# Patient Record
Sex: Female | Born: 1950 | Race: White | Hispanic: No | Marital: Married | Smoking: Never smoker
Health system: Southern US, Community
[De-identification: ages and names within clinical notes are randomized; demographics above are authoritative.]

## PROBLEM LIST (undated history)

## (undated) DIAGNOSIS — M8580 Other specified disorders of bone density and structure, unspecified site: Secondary | ICD-10-CM

## (undated) DIAGNOSIS — Z9221 Personal history of antineoplastic chemotherapy: Secondary | ICD-10-CM

## (undated) DIAGNOSIS — T7840XA Allergy, unspecified, initial encounter: Secondary | ICD-10-CM

## (undated) DIAGNOSIS — N189 Chronic kidney disease, unspecified: Secondary | ICD-10-CM

## (undated) DIAGNOSIS — C801 Malignant (primary) neoplasm, unspecified: Secondary | ICD-10-CM

## (undated) DIAGNOSIS — I10 Essential (primary) hypertension: Secondary | ICD-10-CM

## (undated) DIAGNOSIS — Z923 Personal history of irradiation: Secondary | ICD-10-CM

## (undated) DIAGNOSIS — B019 Varicella without complication: Secondary | ICD-10-CM

## (undated) DIAGNOSIS — M1990 Unspecified osteoarthritis, unspecified site: Secondary | ICD-10-CM

## (undated) DIAGNOSIS — F419 Anxiety disorder, unspecified: Secondary | ICD-10-CM

## (undated) DIAGNOSIS — J45909 Unspecified asthma, uncomplicated: Secondary | ICD-10-CM

## (undated) DIAGNOSIS — C50919 Malignant neoplasm of unspecified site of unspecified female breast: Secondary | ICD-10-CM

## (undated) HISTORY — DX: Unspecified osteoarthritis, unspecified site: M19.90

## (undated) HISTORY — DX: Anxiety disorder, unspecified: F41.9

## (undated) HISTORY — DX: Other specified disorders of bone density and structure, unspecified site: M85.80

## (undated) HISTORY — DX: Varicella without complication: B01.9

## (undated) HISTORY — PX: ABDOMINAL HYSTERECTOMY: SHX81

## (undated) HISTORY — PX: APPENDECTOMY: SHX54

## (undated) HISTORY — DX: Unspecified asthma, uncomplicated: J45.909

## (undated) HISTORY — DX: Essential (primary) hypertension: I10

## (undated) HISTORY — DX: Chronic kidney disease, unspecified: N18.9

## (undated) HISTORY — DX: Allergy, unspecified, initial encounter: T78.40XA

---

## 1988-01-06 DIAGNOSIS — C50919 Malignant neoplasm of unspecified site of unspecified female breast: Secondary | ICD-10-CM

## 1988-01-06 DIAGNOSIS — Z9221 Personal history of antineoplastic chemotherapy: Secondary | ICD-10-CM

## 1988-01-06 HISTORY — DX: Malignant neoplasm of unspecified site of unspecified female breast: C50.919

## 1988-01-06 HISTORY — PX: MASTECTOMY: SHX3

## 1988-01-06 HISTORY — PX: BREAST BIOPSY: SHX20

## 1988-01-06 HISTORY — DX: Personal history of antineoplastic chemotherapy: Z92.21

## 2005-11-25 ENCOUNTER — Ambulatory Visit: Payer: Self-pay | Admitting: Family Medicine

## 2006-09-02 ENCOUNTER — Ambulatory Visit: Payer: Self-pay | Admitting: Unknown Physician Specialty

## 2006-12-29 ENCOUNTER — Ambulatory Visit: Payer: Self-pay | Admitting: Family Medicine

## 2008-01-02 ENCOUNTER — Ambulatory Visit: Payer: Self-pay | Admitting: Family Medicine

## 2009-06-28 ENCOUNTER — Ambulatory Visit: Payer: Self-pay | Admitting: Family Medicine

## 2011-05-07 ENCOUNTER — Ambulatory Visit: Payer: Self-pay | Admitting: Family Medicine

## 2011-11-19 ENCOUNTER — Ambulatory Visit: Payer: Self-pay | Admitting: Unknown Physician Specialty

## 2011-11-23 LAB — PATHOLOGY REPORT

## 2012-01-06 DIAGNOSIS — Z923 Personal history of irradiation: Secondary | ICD-10-CM

## 2012-01-06 HISTORY — DX: Personal history of irradiation: Z92.3

## 2012-06-21 ENCOUNTER — Ambulatory Visit: Payer: Self-pay | Admitting: Obstetrics and Gynecology

## 2012-06-21 LAB — URINALYSIS, COMPLETE
Glucose,UR: NEGATIVE mg/dL (ref 0–75)
Ketone: NEGATIVE
Nitrite: NEGATIVE
Ph: 5 (ref 4.5–8.0)
Protein: NEGATIVE
RBC,UR: 6 /HPF (ref 0–5)
Squamous Epithelial: 1
WBC UR: 27 /HPF (ref 0–5)

## 2012-06-27 ENCOUNTER — Ambulatory Visit: Payer: Self-pay | Admitting: Obstetrics and Gynecology

## 2012-11-14 ENCOUNTER — Ambulatory Visit: Payer: Self-pay | Admitting: Family Medicine

## 2012-11-28 ENCOUNTER — Ambulatory Visit: Payer: Self-pay | Admitting: Obstetrics and Gynecology

## 2012-11-28 LAB — URINALYSIS, COMPLETE
Bacteria: NONE SEEN
Ketone: NEGATIVE
Ph: 5 (ref 4.5–8.0)
Protein: NEGATIVE
RBC,UR: 1 /HPF (ref 0–5)
Specific Gravity: 1.011 (ref 1.003–1.030)
WBC UR: 6 /HPF (ref 0–5)

## 2012-11-28 LAB — HEMOGLOBIN: HGB: 13.8 g/dL (ref 12.0–16.0)

## 2012-12-05 ENCOUNTER — Ambulatory Visit: Payer: Self-pay | Admitting: Obstetrics and Gynecology

## 2012-12-12 ENCOUNTER — Ambulatory Visit: Payer: Self-pay | Admitting: Gynecologic Oncology

## 2012-12-27 ENCOUNTER — Ambulatory Visit: Payer: Self-pay | Admitting: Gynecologic Oncology

## 2013-01-05 ENCOUNTER — Ambulatory Visit: Payer: Self-pay | Admitting: Gynecologic Oncology

## 2013-01-05 DIAGNOSIS — C801 Malignant (primary) neoplasm, unspecified: Secondary | ICD-10-CM

## 2013-01-05 HISTORY — DX: Malignant (primary) neoplasm, unspecified: C80.1

## 2013-02-05 ENCOUNTER — Ambulatory Visit: Payer: Self-pay | Admitting: Radiation Oncology

## 2013-03-05 ENCOUNTER — Ambulatory Visit: Payer: Self-pay | Admitting: Radiation Oncology

## 2013-04-05 ENCOUNTER — Ambulatory Visit: Payer: Self-pay | Admitting: Radiation Oncology

## 2013-06-21 ENCOUNTER — Ambulatory Visit: Payer: Self-pay | Admitting: Radiation Oncology

## 2013-07-11 ENCOUNTER — Ambulatory Visit: Payer: Self-pay | Admitting: Gynecologic Oncology

## 2013-08-03 DIAGNOSIS — J4540 Moderate persistent asthma, uncomplicated: Secondary | ICD-10-CM | POA: Insufficient documentation

## 2013-08-03 DIAGNOSIS — I10 Essential (primary) hypertension: Secondary | ICD-10-CM | POA: Insufficient documentation

## 2013-08-03 DIAGNOSIS — Z853 Personal history of malignant neoplasm of breast: Secondary | ICD-10-CM | POA: Insufficient documentation

## 2013-08-05 ENCOUNTER — Ambulatory Visit: Payer: Self-pay | Admitting: Gynecologic Oncology

## 2013-10-25 ENCOUNTER — Ambulatory Visit: Payer: Self-pay

## 2013-11-05 ENCOUNTER — Ambulatory Visit: Payer: Self-pay

## 2013-11-15 ENCOUNTER — Ambulatory Visit: Payer: Self-pay | Admitting: Internal Medicine

## 2013-12-11 DIAGNOSIS — J302 Other seasonal allergic rhinitis: Secondary | ICD-10-CM | POA: Insufficient documentation

## 2014-04-27 NOTE — Discharge Summary (Signed)
Dates of Admission and Diagnosis:  Date of Admission 05-Dec-2012   Date of Discharge 06-Dec-2012   Admitting Diagnosis PMB, polyps   Final Diagnosis same   Discharge Diagnosis 1 s/p LAVH, BSO, Cystotomy with repair    Chief Complaint/History of Present Illness recurrent PMB.  S/P D&C with benign polyps   Hospital Course:  Hospital Course Did well post-op with excellent UOP.  Tolerating diet from day0   Condition on Discharge Good   DISCHARGE INSTRUCTIONS HOME MEDS:  Medication Reconciliation: Patient's Home Medications at Discharge:     Physician's Instructions:  Home Health? No   Treatments Foley to leg bag   Dressing Care remove dressings   Diet Regular   Activity Limitations No exertional activity  No heavy lifting   Return to Work after follow up visit with MD   Time frame for Follow Up Appointment 1-2 weeks   Electronic Signatures: Edison Nasuti (MD)  (Signed 02-Dec-14 07:31)  Authored: ADMISSION DATE AND DIAGNOSIS, CHIEF COMPLAINT/HPI, HOSPITAL COURSE, [place], PATIENT INSTRUCTIONS   Last Updated: 02-Dec-14 07:31 by Edison Nasuti (MD)

## 2014-04-27 NOTE — Op Note (Signed)
PATIENT NAME:  Audrey Higgins, Audrey Higgins MR#:  [id number] DATE OF BIRTH:  12-23-1950  DATE OF PROCEDURE:  12/05/2012  PREOPERATIVE DIAGNOSIS: Recurrent postmenopausal bleeding.   POSTOPERATIVE DIAGNOSIS: Recurrent postmenopausal bleeding.   PROCEDURES:  1.  Laparoscopic assisted vaginal hysterectomy and bilateral salpingo-oophorectomy. 2.  Repair of incidental cystotomy.  3.  Cystoscopy.  SURGEON: Ricky L. Amalia Hailey, MD  ASSISTANT: Laverta Baltimore, MD  ANESTHESIA: General endotracheal.  FINDINGS: Grossly normal uterus. Ovaries and tubes appeared consistent with previous sterilization procedure. Cystoscopy showed  good repair with good spills from bilateral internal ureteral orifices.   ESTIMATED BLOOD LOSS: 100 mL.  COMPLICATIONS: Bladder cystotomy from below which was repaired.  DRAINS: Foley.  PROCEDURE IN DETAIL: The patient was consented, preoperative antibiotics given, taken to the operating room and placed in the supine position where anesthesia was initiated, placed in the dorsal lithotomy position, prepped and draped in the usual sterile fashion. Foley catheter was inserted. Hulka tenaculum was applied to the cervix and a 10 port was placed infraumbilically and pneumoperitoneum was established. The patient was placed in Trendelenburg position. 5 ports were placed in bilateral lower quadrants with findings as noted above. There were some omental adhesions just to the right of the umbilicus which were left in situ. There was some thickening at the bladder reflexion as well.   Kleppingers were then used to cauterize the infundibulopelvics bilaterally which were divided with Harmonic scalpel. The broad ligaments were developed bilaterally using Harmonic scalpel and the uterine arteries were cauterized. At this point, we turned our attention to the vagina.   The cervix was visualized and grasped, Hulka was removed and replaced a single-tooth tenaculum. A weighted speculum was placed. 10 mL of  dilute vasopressin was injected circumferentially and scalpel was used to circumscribe clockwise from 7 o'clock to 5 o'clock. Direct entry was then made posteriorly using Mayo scissors. Posterior peritoneum was tagged and long-weighted was placed. The uterosacrals were clamped bilaterally, divided and suture ligated with 0 Vicryl.  Attemptd to gain entry anteriorly and immediately encountered what was felt to be bladder mucosa which was confirmed from visualization of foley bulb.  This was deflected anteriorly. I went posterior to the cystotomy and was able to get in to the peritoneal cavity and the remainder of the vaginal hysterectomy was carried out with additional clamps, cuts, and ties using 0 Vicryls. Areas were seen to be hemostatic.  Apices of the rent in the posterior bladder were visualized and measured approximately 1.25 cm from left to right. Running interlocking of 4-0 Vicryl was done from right to left. I then instilled 60 mL of methylene blue stained saline through the Foley catheter. There was still some persistent ooze through the suture line on the right which was re-enforced with 2 more sutures of 4-0 Vicryl. An imbricating layer of 3-0 Vicryl was then placed and there was no further spillage seen with 120 mL of methylene blue stained saline. At this point, the Foley catheter was allowed to empty and the vaginal cuff was closed with running interlocking 0 Vicryl in the usual fashion. The uterosacrals were tied together.   Asked for methylene blue to be given IV and we re-established pneumoperitoneum and looked  through the laparoscope. Areas were seen to be hemostatic. The pneumoperitoneum was allowed to resolve, ports were removed, and incisions closed with deep of 0 Vicryl. Steri-Strips and Band-Aids were applied. Ureteral peristalsis was visualized bilaterally, with normal appearing ureteral caliber noted.  Cystoscopy was then carried out and after 20  minutes there was some trace blue  coming through. Please note that during the laparoscopic evaluation, just recently dictated, ureters were seen to be normal caliber and peristalsing well bilaterally.   At this point, the procedure was felt to have achieved maximum efficacy. The area of repair was seen to be hemostatic cystoscopically. Foley catheter was replaced. The patient was returned to the supine position and left to the care of anesthesia. I anticipate a routine postoperative course.    ____________________________ Rockey Situ. Amalia Hailey, MD rle:sb D: 12/05/2012 10:06:49 ET T: 12/05/2012 10:32:01 ET JOB#: [id number]  cc: Ricky L. Amalia Hailey, MD, <Dictator> Selmer Dominion MD ELECTRONICALLY SIGNED 12/08/2012 9:23

## 2014-04-27 NOTE — Op Note (Signed)
PATIENT NAME:  Audrey Higgins, Audrey Higgins MR#:  [id number] DATE OF BIRTH:  [birth year]  DATE OF PROCEDURE:  06/27/2012  SURGEON: Ricky L. Vedika Dumlao.   PREOPERATIVE DIAGNOSIS: Postmenopausal bleeding.   POSTOPERATIVE DIAGNOSIS: Postmenopausal bleeding.   PROCEDURE: Dilation and curettage, hysteroscopy.   SURGEON: Jalyiah Shelley.    ANESTHESIA: General LMA.   FINDINGS: Stenotic cervix. Grossly unremarkable endometrium. Minimal curettings, no mass, no polyp, etc.    EBL: Minimal.   COMPLICATIONS: None.   SPECIMENS: Endometrial curettings.   DRAINS: In-and-out catheter with a red rubber at the end of the case, with 100 mL of clear urine.   PROCEDURE IN DETAIL: After receiving preoperative antibiotics the patient was taken to the operating room and placed in the supine position where anesthesia was initiated. Placed in the dorsal lithotomy position using Allen stirrups, prepped and draped in the usual sterile fashion. The cervix was visualized and grasped with a single-toothed tenaculum, easily dilated to permit the hysteroscope, with findings as noted above. The hysteroscope was removed and alternating polyp and sharp curettage removed. Minimal amount of curettings. Instruments were removed. The cervix was seen to be hemostatic. The bladder was drained. The patient was turned to the supine position and left to the care of anesthesia. I anticipate a routine postoperative course.    ____________________________ Rockey Situ. Amalia Hailey, MD rle:dm D: 06/27/2012 10:43:02 ET T: 06/27/2012 11:11:27 ET JOB#: [id number]  cc: Ricky L. Amalia Hailey, MD, <Dictator> Selmer Dominion MD ELECTRONICALLY SIGNED 06/28/2012 9:16

## 2014-04-28 NOTE — Consult Note (Signed)
Reason for Visit: This 64 year old Female patient presents to the clinic for initial evaluation of  endometrial carcinoma .   Referred by Dr. Sabra Heck.  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 She eventually had a TAH/BSO  with final pathology showing FIGO grade 2 endometrioid adenocarcinoma 43.5 x 2.5 x 2.5 cm lesion with17 at millimeters invasion of a 22 mm thickness of the endometrial wall. close to 80% of tumor involved greater than 50% myometrial invasion. No lymph vascular invasion was noted cervix was not involved. No lymph node dissection was performed. Patient been seen by Dr. Sabra Heck has done well postoperatively. She is seen today for evaluation regarding radiation therapy. She is having no spotting at this time no dominant complaints no urinary symptoms.  Past Hx:    asthma:    endometrial cancer:    breast cancer:    Appendectomy:    total abdominal hysterectomy:    mastectomy right breast:   Past, Family and Social History:  Past Medical History positive   Respiratory asthma   Past Surgical History appendectomy; mastectomy from breast cancerof the right breast status post chemotherapy and is on tamoxifen therapy and presently   Family History positive   Family History Comments family history positive for mother with breast cancer or 22s grandmother and brother with colon cancer   Social History positive   Social History Comments no smoking history  social EtOH his history   Additional Past Medical and Surgical History accompanied by her husband today   Allergies:   No Known Allergies:   Home Meds:  Home Medications: Medication Instructions Status  Advair Diskus 250 mcg-50 mcg inhalation powder 1 puff(s) inhaled once a day (at bedtime) Active  albuterol CFC free 90 mcg/inh inhalation aerosol 2 puff(s) inhaled once a day, As Needed - for Shortness of Breath Active  Claritin 10 mg oral tablet, disintegrating 1 tab(s) orally once a day Active   Review of Systems:  General negative   Performance Status (ECOG) 0   Skin negative   Breast see HPI   Ophthalmologic negative   ENMT negative   Respiratory and Thorax negative   Cardiovascular negative   Gastrointestinal negative   Genitourinary see HPI   Musculoskeletal negative   Neurological negative   Psychiatric negative   Hematology/Lymphatics negative   Endocrine negative   Allergic/Immunologic negative   Review of Systems   review of systems obtained from nurse's notes  Nursing Notes:  Nursing Vital Signs and Chemo Nursing Nursing Notes: *CC Vital Signs Flowsheet:   06-Jan-15 08:09  Temp Temperature 98.2  Pulse Pulse 90  Respirations Respirations 18  SBP SBP 163  DBP DBP 85  Pain Scale (0-10)  0  Current Weight (kg) (kg) 66.5  Height (cm) centimeters 165.5  BSA (m2) 1.7   Physical Exam:  General/Skin/HEENT:  General normal   Skin normal   Eyes normal   ENMT normal   Head and Neck normal   Additional PE well-developed female in NAD lungs are clear to A&P cardiac examination  is essentially unremarkable. She is status post right mastectomy. Pelvic examination was performed a conjunction with Dr. Sabra Heck. There was some suture material present the vaginal apex that was removed no evidence of mass or nodularity the vaginal apex was noted. Bimanual examination was unremarkable.   Breasts/Resp/CV/GI/GU:  Respiratory and Thorax normal    Cardiovascular normal   Gastrointestinal normal   Genitourinary normal   MS/Neuro/Psych/Lymph:  Musculoskeletal normal   Neurological normal   Lymphatics normal   Other Results:  Radiology Results: LabUnknown:    29-Dec-14 15:08, CT Abdomen and Pelvis With Contrast  PACS Image   CT:  CT Abdomen and Pelvis With Contrast   REASON FOR EXAM:    labs 1st  staging endometrial cancer  COMMENTS:       PROCEDURE: KCT - KCT ABDOMEN/PELVIS W  - Jan 02 2013  3:08PM     CLINICAL DATA:  Staging for endometrial adenocarcinoma status post  hysterectomy 4 weeks ago. Bladder injury during hysterectomy.    EXAM:  CT ABDOMEN AND PELVIS WITH CONTRAST    TECHNIQUE:  Multidetector CT imaging of the abdomen and pelvis was performed  using the standard protocol following bolus administration of  intravenous contrast.  CONTRAST:  115 mm of Isovue 370.    COMPARISON:  No priors.    FINDINGS:  Lung Bases: Unremarkable.    Abdomen/Pelvis: The appearance of the liver, gallbladder, pancreas,  spleen, bilateral adrenal glands and bilateral kidneys is  unremarkable. No significant volume of ascites. No pneumoperitoneum.  No pathologic distention of small bowel. No definite lymphadenopathy  identified within the abdomen or pelvis.    Status post hysterectomy. Ovaries are not confidently identified and  may be surgically absent or atrophic. No abnormal soft tissue masses  are identified within the peritoneal cavity in the abdomen or pelvis  at this time. Urinary bladder is normal in appearance.    Musculoskeletal: There are no aggressive appearing lytic or blastic  lesions noted in thevisualized portions of the skeleton.     IMPRESSION:  1. Status post hysterectomy without evidence to suggest metastatic  disease in the abdomen or pelvis in this patient with history of  endometrial adenocarcinoma.  2. Urinary bladder is grossly normal in appearance on today's study.      Electronically  Signed    By: Vinnie Langton M.D.    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 Plan:   based on the size of her tumor and majority of tumor invading greater than 50% of the myometrium would favor vaginal brachytherapy up to 2350 cGy in 5 fractions using high-dose rate remote afterloading. Do not believe that pelvic radiation therapy using external beam has affected overall survival in these patients and will cause additional morbidity. Favor only vaginal brachytherapy in this patient with this intermediate risk factors for recurrence. Risks and benefits of treatment including potential urinary side effects urgency and frequency, diarrhea, fatigue, and fibrosis of the vaginal cuff all were explained in detail to the patient and her husband. Both seem to comprehend my treatment plan well. We'll now allow another couple weeks of healing and have scheduled for her simulation in the middle of next week.  I would like  to take this opportunity to thank you for allowing me to continue to participate in this patient's care.  CC Referral:  cc: Dr. Amalia Hailey, Dr. Candiss Norse   Electronic Signatures: Baruch Gouty, Roda Shutters (MD)  (Signed 06-Jan-15 11:19)  Authored: HPI, Diagnosis, Past Hx, PFSH, Allergies, Home Meds, ROS, Nursing Notes, Physical Exam, Other Results, Relevent Results, Encounter Assessment and Plan, CC Referring Physician   Last Updated: 06-Jan-15 11:19 by Armstead Peaks (MD)

## 2014-12-17 DIAGNOSIS — N183 Chronic kidney disease, stage 3 unspecified: Secondary | ICD-10-CM | POA: Insufficient documentation

## 2015-04-11 IMAGING — MG MM MAMMO SCREENING UNILAT*L*
1 series · 2 of 2 positions shown · non-contrast
Comparison: Prior studies dating back to 11/25/2005

REASON FOR EXAM: HX BRST CA RT MAST
COMMENTS:

PROCEDURE:     MAM - MAM DGTL UNI SCREEN LEFT W/CAD  - November 14, 2012  [DATE]
CLINICAL DATA: Screening.
DIGITAL SCREENING UNILATERAL LEFT MAMMOGRAM

[L CC · left · 2 of 2 slices shown]
[im 1/2]
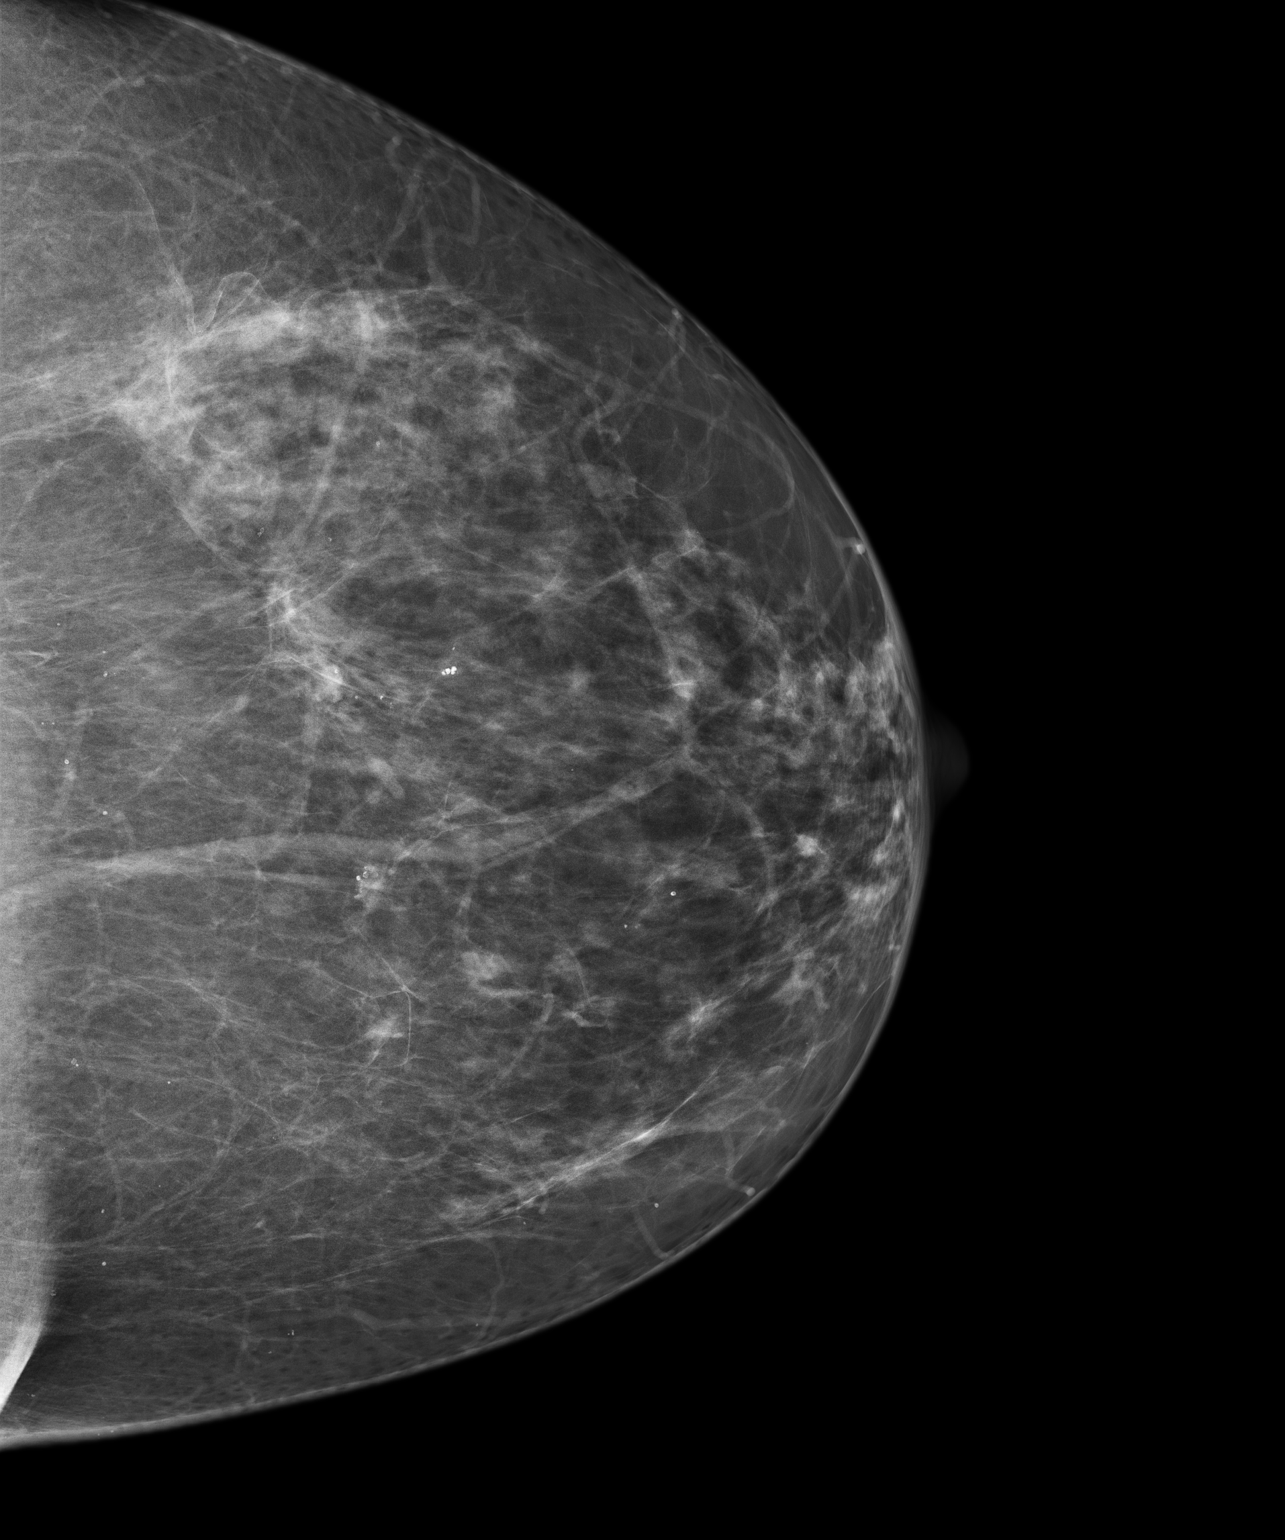
[im 2/2]
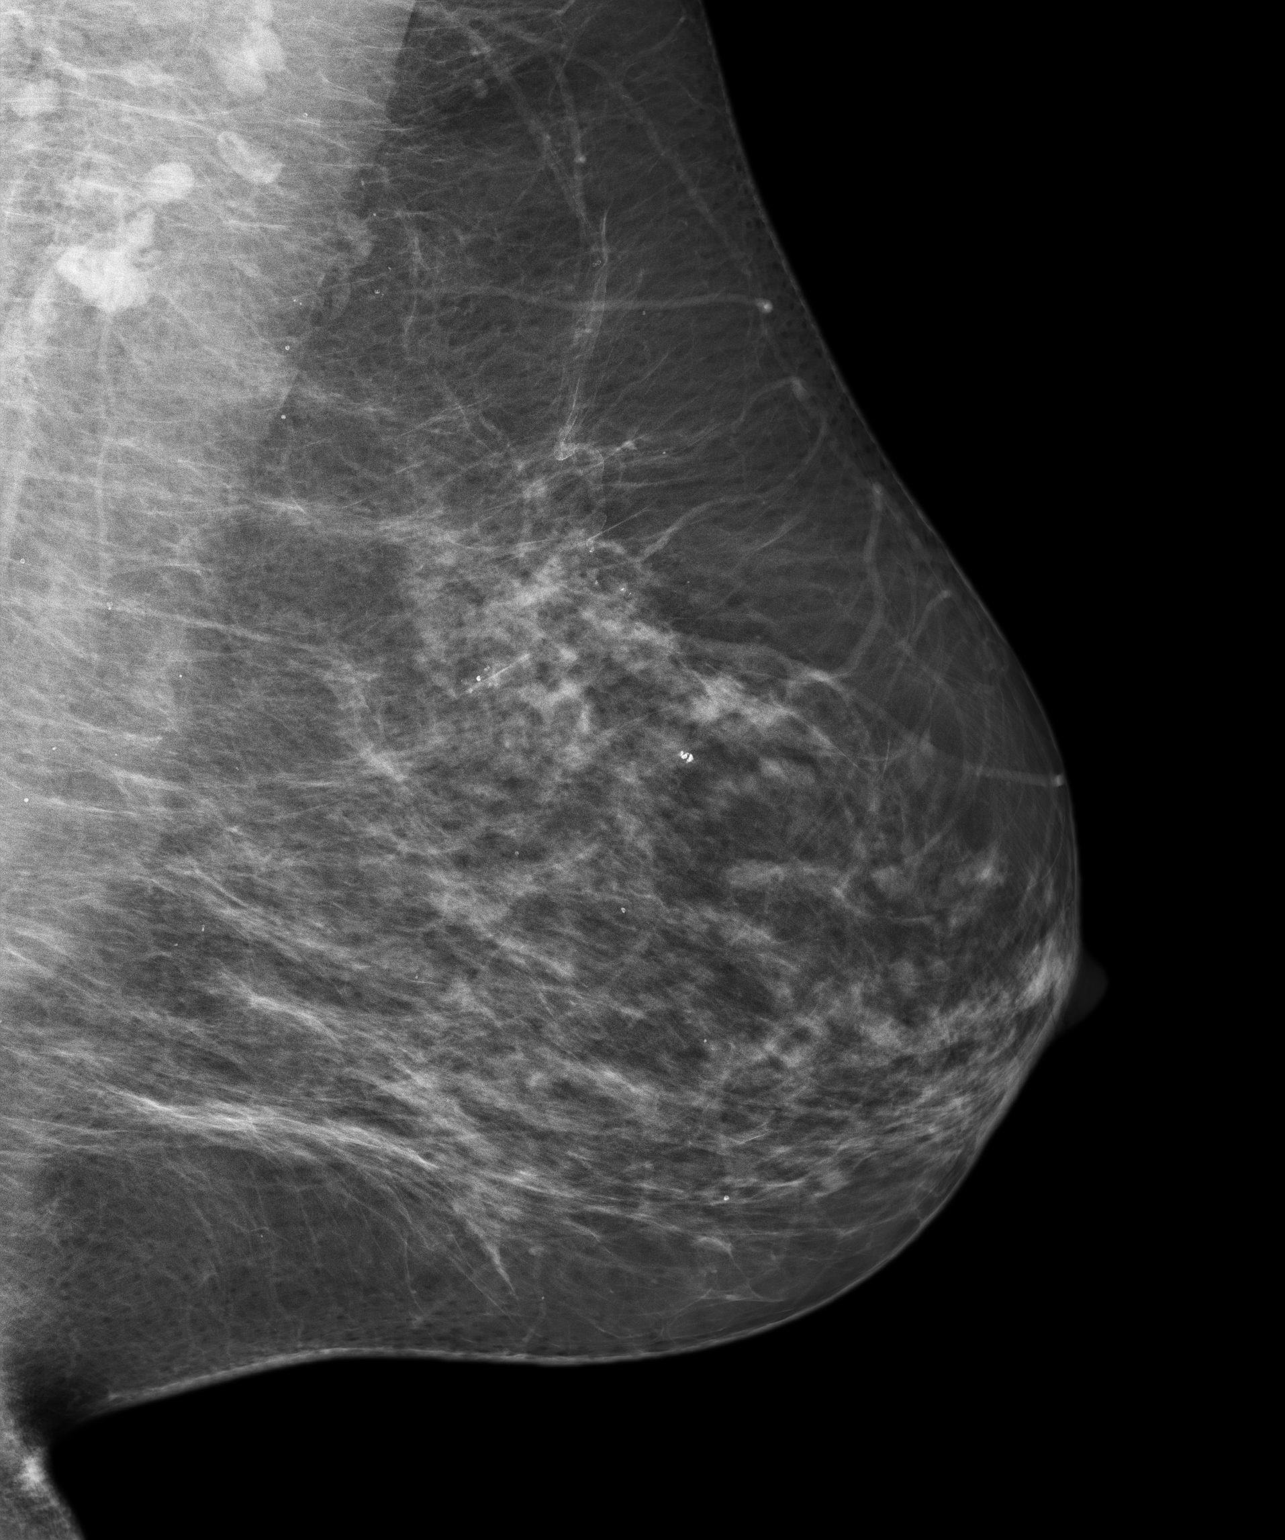

[2 of 2 positions shown; findings below may reference images not displayed]

FINDINGS: ACR Breast Density Category b: There are scattered areas of
fibroglandular density.

There is no suspicious dominant mass, architectural distortion, or
calcification to suggest malignancy.
IMPRESSION: No mammographic evidence of malignancy.

A result letter of this screening mammogram will be mailed directly
to the patient.

RECOMMENDATION:
Screening mammogram in one year. (Code:RB-[id number])

BI-RADS CATEGORY 1:  Negative.

## 2015-06-17 ENCOUNTER — Other Ambulatory Visit: Payer: Self-pay | Admitting: Internal Medicine

## 2015-06-17 DIAGNOSIS — Z1231 Encounter for screening mammogram for malignant neoplasm of breast: Secondary | ICD-10-CM

## 2015-07-04 ENCOUNTER — Ambulatory Visit
Admission: RE | Admit: 2015-07-04 | Discharge: 2015-07-04 | Disposition: A | Discharge status: Home / Self Care | Payer: 59 | Source: Ambulatory Visit | Attending: Internal Medicine | Admitting: Internal Medicine

## 2015-07-04 ENCOUNTER — Other Ambulatory Visit: Payer: Self-pay | Admitting: Internal Medicine

## 2015-07-04 DIAGNOSIS — Z1231 Encounter for screening mammogram for malignant neoplasm of breast: Secondary | ICD-10-CM | POA: Diagnosis not present

## 2015-07-04 HISTORY — DX: Malignant (primary) neoplasm, unspecified: C80.1

## 2015-07-04 HISTORY — DX: Malignant neoplasm of unspecified site of unspecified female breast: C50.919

## 2015-12-17 DIAGNOSIS — Z780 Asymptomatic menopausal state: Secondary | ICD-10-CM | POA: Insufficient documentation

## 2016-06-15 ENCOUNTER — Other Ambulatory Visit: Payer: Self-pay | Admitting: Internal Medicine

## 2016-06-15 DIAGNOSIS — Z1231 Encounter for screening mammogram for malignant neoplasm of breast: Secondary | ICD-10-CM

## 2016-06-17 DIAGNOSIS — R030 Elevated blood-pressure reading, without diagnosis of hypertension: Secondary | ICD-10-CM | POA: Insufficient documentation

## 2016-06-17 DIAGNOSIS — G4709 Other insomnia: Secondary | ICD-10-CM | POA: Insufficient documentation

## 2016-07-06 ENCOUNTER — Ambulatory Visit
Admission: RE | Admit: 2016-07-06 | Discharge: 2016-07-06 | Disposition: A | Discharge status: Home / Self Care | Payer: 59 | Source: Ambulatory Visit | Attending: Internal Medicine | Admitting: Internal Medicine

## 2016-07-06 ENCOUNTER — Other Ambulatory Visit: Payer: Self-pay | Admitting: Internal Medicine

## 2016-07-06 DIAGNOSIS — Z9011 Acquired absence of right breast and nipple: Secondary | ICD-10-CM | POA: Insufficient documentation

## 2016-07-06 DIAGNOSIS — Z1231 Encounter for screening mammogram for malignant neoplasm of breast: Secondary | ICD-10-CM

## 2016-07-06 HISTORY — DX: Personal history of antineoplastic chemotherapy: Z92.21

## 2016-07-06 HISTORY — DX: Personal history of irradiation: Z92.3

## 2017-04-27 DIAGNOSIS — F411 Generalized anxiety disorder: Secondary | ICD-10-CM | POA: Insufficient documentation

## 2019-08-25 ENCOUNTER — Other Ambulatory Visit: Payer: Self-pay

## 2019-08-29 ENCOUNTER — Other Ambulatory Visit: Payer: Self-pay

## 2019-08-29 ENCOUNTER — Encounter: Payer: Self-pay | Admitting: Nurse Practitioner

## 2019-08-29 ENCOUNTER — Ambulatory Visit (INDEPENDENT_AMBULATORY_CARE_PROVIDER_SITE_OTHER): Payer: Medicare HMO | Admitting: Nurse Practitioner

## 2019-08-29 VITALS — BP 170/100 | HR 86 | Temp 98.6°F | Ht 64.0 in | Wt 149.0 lb

## 2019-08-29 DIAGNOSIS — R7303 Prediabetes: Secondary | ICD-10-CM

## 2019-08-29 DIAGNOSIS — Z0000 Encounter for general adult medical examination without abnormal findings: Secondary | ICD-10-CM

## 2019-08-29 DIAGNOSIS — J45909 Unspecified asthma, uncomplicated: Secondary | ICD-10-CM | POA: Diagnosis not present

## 2019-08-29 DIAGNOSIS — J4540 Moderate persistent asthma, uncomplicated: Secondary | ICD-10-CM

## 2019-08-29 DIAGNOSIS — J302 Other seasonal allergic rhinitis: Secondary | ICD-10-CM

## 2019-08-29 DIAGNOSIS — L700 Acne vulgaris: Secondary | ICD-10-CM

## 2019-08-29 DIAGNOSIS — I159 Secondary hypertension, unspecified: Secondary | ICD-10-CM | POA: Insufficient documentation

## 2019-08-29 DIAGNOSIS — Z1231 Encounter for screening mammogram for malignant neoplasm of breast: Secondary | ICD-10-CM

## 2019-08-29 DIAGNOSIS — M810 Age-related osteoporosis without current pathological fracture: Secondary | ICD-10-CM | POA: Insufficient documentation

## 2019-08-29 DIAGNOSIS — I10 Essential (primary) hypertension: Secondary | ICD-10-CM | POA: Diagnosis not present

## 2019-08-29 DIAGNOSIS — Z86010 Personal history of colonic polyps: Secondary | ICD-10-CM

## 2019-08-29 DIAGNOSIS — Z8542 Personal history of malignant neoplasm of other parts of uterus: Secondary | ICD-10-CM

## 2019-08-29 DIAGNOSIS — M8580 Other specified disorders of bone density and structure, unspecified site: Secondary | ICD-10-CM | POA: Insufficient documentation

## 2019-08-29 HISTORY — DX: Personal history of malignant neoplasm of other parts of uterus: Z85.42

## 2019-08-29 MED ORDER — AMLODIPINE BESYLATE 5 MG PO TABS: 2.5000 mg | ORAL_TABLET | Freq: Every day | ORAL | 1 refills | Status: DC

## 2019-08-29 MED ORDER — LORATADINE 10 MG PO TABS: 10.0000 mg | ORAL_TABLET | Freq: Every day | ORAL | 0 refills | Status: AC

## 2019-08-29 MED ORDER — FLUTICASONE PROPIONATE HFA 110 MCG/ACT IN AERO
2.0000 | INHALATION_SPRAY | Freq: Every day | RESPIRATORY_TRACT | 3 refills | Status: DC
Start: 2019-08-29 — End: 2021-05-20

## 2019-08-29 MED ORDER — ALBUTEROL SULFATE HFA 108 (90 BASE) MCG/ACT IN AERS
INHALATION_SPRAY | RESPIRATORY_TRACT | 3 refills | Status: DC
Start: 2019-08-29 — End: 2021-05-20

## 2019-08-29 NOTE — Patient Instructions (Addendum)
Please set up appt for fasting labs this week if possible  BP 160/99 by machine and 170/100 by me: Begin very low dose amlodipine 2.5 mg one time daily. Check BP daily for 3 days and call in readings. See me in 2 weeks for visit.      I have ordered mammo and bone density.  Please call and schedule your 3D mammogram and bone density as discussed.   [place]  [address] [place], [place] [phone]  Referral to GI for colonoscopy you were due in 2016 - per record review.   Referral to [facility] for blackhead on back   Please f/up in 3 mos    Preventive Care 65 Years and Older, Female Preventive care refers to lifestyle choices and visits with your health care provider that can promote health and wellness. This includes:  A yearly physical exam. This is also called an annual well check.  Regular dental and eye exams.  Immunizations.  Screening for certain conditions.  Healthy lifestyle choices, such as diet and exercise. What can I expect for my preventive care visit? Physical exam Your health care provider will check:  Height and weight. These may be used to calculate body mass index (BMI), which is a measurement that tells if you are at a healthy weight.  Heart rate and blood pressure.  Your skin for abnormal spots. Counseling Your health care provider may ask you questions about:  Alcohol, tobacco, and drug use.  Emotional well-being.  Home and relationship well-being.  Sexual activity.  Eating habits.  History of falls.  Memory and ability to understand (cognition).  Work and work Statistician.  Pregnancy and menstrual history. What immunizations do I need?  Influenza (flu) vaccine  This is recommended every year. Tetanus, diphtheria, and pertussis (Tdap) vaccine  You may need a Td booster every 10 years. Varicella (chickenpox) vaccine  You may need this vaccine if you have not already been vaccinated. Zoster  (shingles) vaccine  You may need this after age 81. Pneumococcal conjugate (PCV13) vaccine  One dose is recommended after age 90. Pneumococcal polysaccharide (PPSV23) vaccine  One dose is recommended after age 49. Measles, mumps, and rubella (MMR) vaccine  You may need at least one dose of MMR if you were born in 1957 or later. You may also need a second dose. Meningococcal conjugate (MenACWY) vaccine  You may need this if you have certain conditions. Hepatitis A vaccine  You may need this if you have certain conditions or if you travel or work in places where you may be exposed to hepatitis A. Hepatitis B vaccine  You may need this if you have certain conditions or if you travel or work in places where you may be exposed to hepatitis B. Haemophilus influenzae type b (Hib) vaccine  You may need this if you have certain conditions. You may receive vaccines as individual doses or as more than one vaccine together in one shot (combination vaccines). Talk with your health care provider about the risks and benefits of combination vaccines. What tests do I need? Blood tests  Lipid and cholesterol levels. These may be checked every 5 years, or more frequently depending on your overall health.  Hepatitis C test.  Hepatitis B test. Screening  Lung cancer screening. You may have this screening every year starting at age 57 if you have a 30-pack-year history of smoking and currently smoke or have quit within the past 15 years.  Colorectal cancer screening. All adults  should have this screening starting at age 27 and continuing until age 41. Your health care provider may recommend screening at age 58 if you are at increased risk. You will have tests every 1-10 years, depending on your results and the type of screening test.  Diabetes screening. This is done by checking your blood sugar (glucose) after you have not eaten for a while (fasting). You may have this done every 1-3  years.  Mammogram. This may be done every 1-2 years. Talk with your health care provider about how often you should have regular mammograms.  BRCA-related cancer screening. This may be done if you have a family history of breast, ovarian, tubal, or peritoneal cancers. Other tests  Sexually transmitted disease (STD) testing.  Bone density scan. This is done to screen for osteoporosis. You may have this done starting at age 22. Follow these instructions at home: Eating and drinking  Eat a diet that includes fresh fruits and vegetables, whole grains, lean protein, and low-fat dairy products. Limit your intake of foods with high amounts of sugar, saturated fats, and salt.  Take vitamin and mineral supplements as recommended by your health care provider.  Do not drink alcohol if your health care provider tells you not to drink.  If you drink alcohol: ? Limit how much you have to 0-1 drink a day. ? Be aware of how much alcohol is in your drink. In the [place] one drink equals one 12 oz bottle of beer (355 mL), one 5 oz glass of wine (148 mL), or one 1 oz glass of hard liquor (44 mL). Lifestyle  Take daily care of your teeth and gums.  Stay active. Exercise for at least 30 minutes on 5 or more days each week.  Do not use any products that contain nicotine or tobacco, such as cigarettes, e-cigarettes, and chewing tobacco. If you need help quitting, ask your health care provider.  If you are sexually active, practice safe sex. Use a condom or other form of protection in order to prevent STIs (sexually transmitted infections).  Talk with your health care provider about taking a low-dose aspirin or statin. What's next?  Go to your health care provider once a year for a well check visit.  Ask your health care provider how often you should have your eyes and teeth checked.  Stay up to date on all vaccines. This information is not intended to replace advice given to you by your health care  provider. Make sure you discuss any questions you have with your health care provider. Document Revised: 12/16/2017 Document Reviewed: 12/16/2017 Elsevier Patient Education  [place].  Hypertension, Adult High blood pressure (hypertension) is when the force of blood pumping through the arteries is too strong. The arteries are the blood vessels that carry blood from the heart throughout the body. Hypertension forces the heart to work harder to pump blood and may cause arteries to become narrow or stiff. Untreated or uncontrolled hypertension can cause a heart attack, heart failure, a stroke, kidney disease, and other problems. A blood pressure reading consists of a higher number over a lower number. Ideally, your blood pressure should be below 120/80. The first ("top") number is called the systolic pressure. It is a measure of the pressure in your arteries as your heart beats. The second ("bottom") number is called the diastolic pressure. It is a measure of the pressure in your arteries as the heart relaxes. What are the causes? The exact cause of this  condition is not known. There are some conditions that result in or are related to high blood pressure. What increases the risk? Some risk factors for high blood pressure are under your control. The following factors may make you more likely to develop this condition:  Smoking.  Having type 2 diabetes mellitus, high cholesterol, or both.  Not getting enough exercise or physical activity.  Being overweight.  Having too much fat, sugar, calories, or salt (sodium) in your diet.  Drinking too much alcohol. Some risk factors for high blood pressure may be difficult or impossible to change. Some of these factors include:  Having chronic kidney disease.  Having a family history of high blood pressure.  Age. Risk increases with age.  Race. You may be at higher risk if you are African American.  Gender. Men are at higher risk than  women before age 27. After age 72, women are at higher risk than men.  Having obstructive sleep apnea.  Stress. What are the signs or symptoms? High blood pressure may not cause symptoms. Very high blood pressure (hypertensive crisis) may cause:  Headache.  Anxiety.  Shortness of breath.  Nosebleed.  Nausea and vomiting.  Vision changes.  Severe chest pain.  Seizures. How is this diagnosed? This condition is diagnosed by measuring your blood pressure while you are seated, with your arm resting on a flat surface, your legs uncrossed, and your feet flat on the floor. The cuff of the blood pressure monitor will be placed directly against the skin of your upper arm at the level of your heart. It should be measured at least twice using the same arm. Certain conditions can cause a difference in blood pressure between your right and left arms. Certain factors can cause blood pressure readings to be lower or higher than normal for a short period of time:  When your blood pressure is higher when you are in a health care provider's office than when you are at home, this is called white coat hypertension. Most people with this condition do not need medicines.  When your blood pressure is higher at home than when you are in a health care provider's office, this is called masked hypertension. Most people with this condition may need medicines to control blood pressure. If you have a high blood pressure reading during one visit or you have normal blood pressure with other risk factors, you may be asked to:  Return on a different day to have your blood pressure checked again.  Monitor your blood pressure at home for 1 week or longer. If you are diagnosed with hypertension, you may have other blood or imaging tests to help your health care provider understand your overall risk for other conditions. How is this treated? This condition is treated by making healthy lifestyle changes, such as eating  healthy foods, exercising more, and reducing your alcohol intake. Your health care provider may prescribe medicine if lifestyle changes are not enough to get your blood pressure under control, and if:  Your systolic blood pressure is above 130.  Your diastolic blood pressure is above 80. Your personal target blood pressure may vary depending on your medical conditions, your age, and other factors. Follow these instructions at home: Eating and drinking   Eat a diet that is high in fiber and potassium, and low in sodium, added sugar, and fat. An example eating plan is called the DASH (Dietary Approaches to Stop Hypertension) diet. To eat this way: ? Eat plenty of fresh  fruits and vegetables. Try to fill one half of your plate at each meal with fruits and vegetables. ? Eat whole grains, such as whole-wheat pasta, brown rice, or whole-grain bread. Fill about one fourth of your plate with whole grains. ? Eat or drink low-fat dairy products, such as skim milk or low-fat yogurt. ? Avoid fatty cuts of meat, processed or cured meats, and poultry with skin. Fill about one fourth of your plate with lean proteins, such as fish, chicken without skin, beans, eggs, or tofu. ? Avoid pre-made and processed foods. These tend to be higher in sodium, added sugar, and fat.  Reduce your daily sodium intake. Most people with hypertension should eat less than 1,500 mg of sodium a day.  Do not drink alcohol if: ? Your health care provider tells you not to drink. ? You are pregnant, may be pregnant, or are planning to become pregnant.  If you drink alcohol: ? Limit how much you use to:  0-1 drink a day for women.  0-2 drinks a day for men. ? Be aware of how much alcohol is in your drink. In the [place] one drink equals one 12 oz bottle of beer (355 mL), one 5 oz glass of wine (148 mL), or one 1 oz glass of hard liquor (44 mL). Lifestyle   Work with your health care provider to maintain a healthy body weight or  to lose weight. Ask what an ideal weight is for you.  Get at least 30 minutes of exercise most days of the week. Activities may include walking, swimming, or biking.  Include exercise to strengthen your muscles (resistance exercise), such as Pilates or lifting weights, as part of your weekly exercise routine. Try to do these types of exercises for 30 minutes at least 3 days a week.  Do not use any products that contain nicotine or tobacco, such as cigarettes, e-cigarettes, and chewing tobacco. If you need help quitting, ask your health care provider.  Monitor your blood pressure at home as told by your health care provider.  Keep all follow-up visits as told by your health care provider. This is important. Medicines  Take over-the-counter and prescription medicines only as told by your health care provider. Follow directions carefully. Blood pressure medicines must be taken as prescribed.  Do not skip doses of blood pressure medicine. Doing this puts you at risk for problems and can make the medicine less effective.  Ask your health care provider about side effects or reactions to medicines that you should watch for. Contact a health care provider if you:  Think you are having a reaction to a medicine you are taking.  Have headaches that keep coming back (recurring).  Feel dizzy.  Have swelling in your ankles.  Have trouble with your vision. Get help right away if you:  Develop a severe headache or confusion.  Have unusual weakness or numbness.  Feel faint.  Have severe pain in your chest or abdomen.  Vomit repeatedly.  Have trouble breathing. Summary  Hypertension is when the force of blood pumping through your arteries is too strong. If this condition is not controlled, it may put you at risk for serious complications.  Your personal target blood pressure may vary depending on your medical conditions, your age, and other factors. For most people, a normal blood  pressure is less than 120/80.  Hypertension is treated with lifestyle changes, medicines, or a combination of both. Lifestyle changes include losing weight, eating a healthy, low-sodium diet,  exercising more, and limiting alcohol. This information is not intended to replace advice given to you by your health care provider. Make sure you discuss any questions you have with your health care provider. Document Revised: 09/01/2017 Document Reviewed: 09/01/2017 Elsevier Patient Education  2020 [employer].

## 2019-08-29 NOTE — Progress Notes (Signed)
New Patient Office Visit  Subjective:  Patient ID: Audrey Higgins, female    DOB: 01-Apr-1950  Age: 69 y.o. MRN: [id number]  CC:  Chief Complaint  Patient presents with  . New Patient (Initial Visit)    establish care    HPI Audrey Higgins is a 69 yo with HTN, CKD, hx of breast ca, hx of endometrial ca,  asthma, allergic rhinitis, GAD, osteoporosis, who presents to establish care with a primary care provider and ger her medications refilled. She has no complaints today.   Asthma Mod controlled : Her asthma is well controlled usually.  She is very sensitive to environmental allergies and smells.  She can have a tightness when dust, aerosol sprays, cleaning sprays.  She is on daily Flovent . she utilizes albuterol maybe once every 2 weeks.  No nocturnal awakening.    HTN:  Chart review shows BP 150/80 on 04/28/2018. 138/90 on 04/27/2017.  She has not been placed on blood pressure medication. BP by me today is 160/99 left and 170/100 rt. Rechecked at rest with automatic cuff.  She denies any chest pain, pressure, shortness of breath, DOE, dizziness, lightheadedness.  BP Readings from Last 3 Encounters:  08/29/19 (!) 170/100    BMI 25/Pre DM: A1c 5.7: Healthy diet and exercise  Immunizations: Diet:10 yrs ago- Exercise: Colonoscopy:due- polyps told 7 year return Dexa:yes- NL- Pap Smear: Vision: no x years Dentist: Last month  Mammogram:2018   Past Medical History:  Diagnosis Date  . Allergy   . Arthritis   . Asthma   . Breast cancer ([place]) 1990   RT MASTECTOMY  . Cancer ([place]) 2015   Uterine  . Chicken pox   . Chronic kidney disease   . History of uterine cancer 08/29/2019  . Personal history of chemotherapy 1990   BREAST CA  . Personal history of radiation therapy 2014   UTERINE CA    Past Surgical History:  Procedure Laterality Date  . ABDOMINAL HYSTERECTOMY    . APPENDECTOMY    . BREAST BIOPSY Right 1990   POS  . CESAREAN SECTION    . MASTECTOMY Right 1990     BREAST CA    Family History  Problem Relation Age of Onset  . Breast cancer Mother 91  . Arthritis Mother   . Cancer Mother   . Cancer Father   . Hypertension Father   . Cancer Brother   . Diabetes Son   . Hypertension Son     Social History   Socioeconomic History  . Marital status: Married    Spouse name: Not on file  . Number of children: Not on file  . Years of education: Not on file  . Highest education level: Not on file  Occupational History  . Not on file  Tobacco Use  . Smoking status: Never Smoker  . Smokeless tobacco: Never Used  Vaping Use  . Vaping Use: Never used  Substance and Sexual Activity  . Alcohol use: Not Currently  . Drug use: Never  . Sexual activity: Yes  Other Topics Concern  . Not on file  Social History Narrative  . Not on file   Social Determinants of Health   Financial Resource Strain:   . Difficulty of Paying Living Expenses: Not on file  Food Insecurity:   . Worried About Charity fundraiser in the Last Year: Not on file  . Ran Out of Food in the Last Year: Not on file  Transportation Needs:   .  Lack of Transportation (Medical): Not on file  . Lack of Transportation (Non-Medical): Not on file  Physical Activity:   . Days of Exercise per Week: Not on file  . Minutes of Exercise per Session: Not on file  Stress:   . Feeling of Stress : Not on file  Social Connections:   . Frequency of Communication with Friends and Family: Not on file  . Frequency of Social Gatherings with Friends and Family: Not on file  . Attends Religious Services: Not on file  . Active Member of Clubs or Organizations: Not on file  . Attends Archivist Meetings: Not on file  . Marital Status: Not on file  Intimate Partner Violence:   . Fear of Current or Ex-Partner: Not on file  . Emotionally Abused: Not on file  . Physically Abused: Not on file  . Sexually Abused: Not on file    Review of Systems  Constitutional: Negative.   HENT:  Negative.   Eyes: Negative.   Respiratory: Positive for wheezing. Negative for cough and shortness of breath.        See HPI- no active asthma flares  Cardiovascular: Negative for chest pain, palpitations and leg swelling.  Gastrointestinal: Negative.   Endocrine: Negative.   Genitourinary: Negative.   Musculoskeletal: Negative.   Skin: Negative.   Allergic/Immunologic: Positive for environmental allergies.  Neurological: Negative.   Hematological: Negative.   Psychiatric/Behavioral: Negative.        No concerns about depression or anxiety.  GAD-7:2.  PHQ-9: 0.     Objective:   Today's Vitals: BP (!) 170/100   Pulse 86   Temp 98.6 F (37 C) (Oral)   Ht 5\' 4"  (1.626 m)   Wt 149 lb (67.6 kg)   SpO2 96%   BMI 25.58 kg/m   Physical Exam Constitutional:      Appearance: Normal appearance. She is normal weight.  HENT:     Head: Normocephalic.  Eyes:     Conjunctiva/sclera: Conjunctivae normal.     Pupils: Pupils are equal, round, and reactive to light.  Cardiovascular:     Rate and Rhythm: Normal rate and regular rhythm.     Pulses: Normal pulses.     Heart sounds: Normal heart sounds. No murmur heard.   Pulmonary:     Effort: Pulmonary effort is normal.     Breath sounds: Normal breath sounds. No wheezing or rales.  Abdominal:     Palpations: Abdomen is soft.     Tenderness: There is no abdominal tenderness.  Musculoskeletal:        General: Normal range of motion.     Cervical back: Normal range of motion and neck supple.  Skin:    General: Skin is warm and dry.  Neurological:     General: No focal deficit present.     Mental Status: She is alert and oriented to person, place, and time.  Psychiatric:        Mood and Affect: Mood normal.        Behavior: Behavior normal.     Assessment & Plan:   Problem List Items Addressed This Visit      Cardiovascular and Mediastinum   Essential hypertension    Chart review shows elevated BP in office settings. BP  160/99 by machine and 170/100 by me: Begin very low dose amlodipine 2.5 mg one time daily. Check BP daily for 3 days and call in readings. See me in 2 weeks for visit.  Relevant Medications   amLODipine (NORVASC) 5 MG tablet     Respiratory   Moderate persistent asthma without complication    Refilled her inhalers.  Consider pulmonary consult for baseline PFTs.      Relevant Medications   albuterol (VENTOLIN HFA) 108 (90 Base) MCG/ACT inhaler   fluticasone (FLOVENT HFA) 110 MCG/ACT inhaler   Seasonal allergic rhinitis   RESOLVED: Moderate asthma without complication   Relevant Medications   albuterol (VENTOLIN HFA) 108 (90 Base) MCG/ACT inhaler   fluticasone (FLOVENT HFA) 110 MCG/ACT inhaler     Digestive   Polyp of colon     Musculoskeletal and Integument   Osteoporosis   Relevant Orders   DG Bone Density   VITAMIN D 25 Hydroxy (Vit-D Deficiency, Fractures) (Completed)   Black head    She has a blackhead in the center of her back that she would like removed.  Referral to dermatology      Relevant Orders   Ambulatory referral to Dermatology     Other   History of uterine cancer   Relevant Orders   CBC with Differential/Platelet (Completed)   Pre-diabetes   Relevant Orders   TSH (Completed)   Comprehensive metabolic panel (Completed)   Hemoglobin A1c (Completed)   Lipid panel (Completed)   Encounter for medical examination to establish care - Primary   Relevant Orders   B12 and Folate Panel (Completed)   Encounter for screening mammogram for malignant neoplasm of breast   Relevant Orders   MM 3D SCREEN BREAST UNI LEFT      Outpatient Encounter Medications as of 08/29/2019  Medication Sig  . albuterol (VENTOLIN HFA) 108 (90 Base) MCG/ACT inhaler Inhale 2 inhalations into the lungs every 6 (six) hours as needed forWheezing.  . fluticasone (FLOVENT HFA) 110 MCG/ACT inhaler Inhale 2 puffs into the lungs daily.  Marland Kitchen loratadine (CLARITIN) 10 MG tablet Take 1  tablet (10 mg total) by mouth daily.  . Multiple Vitamin (MULTI-VITAMIN) tablet Take 1 tablet by mouth daily.  . [DISCONTINUED] albuterol (VENTOLIN HFA) 108 (90 Base) MCG/ACT inhaler Inhale 2 inhalations into the lungs every 6 (six) hours as needed forWheezing.  . [DISCONTINUED] fluticasone (FLOVENT HFA) 110 MCG/ACT inhaler Inhale into the lungs.  . [DISCONTINUED] loratadine (CLARITIN) 10 MG tablet Take by mouth.  Marland Kitchen amLODipine (NORVASC) 5 MG tablet Take 0.5 tablets (2.5 mg total) by mouth daily.   No facility-administered encounter medications on file as of 08/29/2019.   Patient advised:  Please set up appt for fasting labs this week if possible Chart review shows elevated BP in office settings. BP 160/99 by machine and 170/100 by me: Begin very low dose amlodipine 2.5 mg one time daily. Check BP daily for 3 days and call in readings. See me in 2 weeks for visit.    I have ordered Mammo and bone density.  Please call and schedule your 3 D mammogram and bone density as discussed.   [place]  [address] [place], [place] [phone]  Referral to GI for colonoscopy you were due in 2016 - per record review.   Referral to [facility] for blackhead on back.  Follow-up: Return in about 2 weeks (around 09/12/2019).    This visit occurred during the SARS-CoV-2 public health emergency.  Safety protocols were in place, including screening questions prior to the visit, additional usage of staff PPE, and extensive cleaning of exam room while observing appropriate contact time as indicated for disinfecting solutions.   Denice Paradise, NP

## 2019-08-30 ENCOUNTER — Other Ambulatory Visit (INDEPENDENT_AMBULATORY_CARE_PROVIDER_SITE_OTHER): Payer: Medicare HMO

## 2019-08-30 ENCOUNTER — Other Ambulatory Visit: Payer: Self-pay

## 2019-08-30 DIAGNOSIS — R7303 Prediabetes: Secondary | ICD-10-CM | POA: Diagnosis not present

## 2019-08-30 DIAGNOSIS — Z0000 Encounter for general adult medical examination without abnormal findings: Secondary | ICD-10-CM

## 2019-08-30 DIAGNOSIS — Z8542 Personal history of malignant neoplasm of other parts of uterus: Secondary | ICD-10-CM | POA: Diagnosis not present

## 2019-08-30 DIAGNOSIS — M810 Age-related osteoporosis without current pathological fracture: Secondary | ICD-10-CM

## 2019-08-30 LAB — CBC WITH DIFFERENTIAL/PLATELET
Basophils Absolute: 0.1 10*3/uL (ref 0.0–0.1)
Basophils Relative: 0.8 % (ref 0.0–3.0)
Eosinophils Absolute: 0.1 10*3/uL (ref 0.0–0.7)
Eosinophils Relative: 1.8 % (ref 0.0–5.0)
HCT: 39.5 % (ref 36.0–46.0)
Hemoglobin: 12.9 g/dL (ref 12.0–15.0)
Lymphocytes Relative: 26.7 % (ref 12.0–46.0)
Lymphs Abs: 2 10*3/uL (ref 0.7–4.0)
MCHC: 32.7 g/dL (ref 30.0–36.0)
MCV: 87.3 fl (ref 78.0–100.0)
Monocytes Absolute: 0.8 10*3/uL (ref 0.1–1.0)
Monocytes Relative: 10 % (ref 3.0–12.0)
Neutro Abs: 4.6 10*3/uL (ref 1.4–7.7)
Neutrophils Relative %: 60.7 % (ref 43.0–77.0)
Platelets: 222 10*3/uL (ref 150.0–400.0)
RBC: 4.52 Mil/uL (ref 3.87–5.11)
RDW: 13.6 % (ref 11.5–15.5)
WBC: 7.6 10*3/uL (ref 4.0–10.5)

## 2019-08-30 LAB — B12 AND FOLATE PANEL
Folate: 12.9 ng/mL (ref >5.9)
Vitamin B-12: 233 pg/mL (ref 211–911)

## 2019-08-30 LAB — COMPREHENSIVE METABOLIC PANEL
ALT: 11 U/L (ref 0–35)
AST: 12 U/L (ref 0–37)
Albumin: 4.4 g/dL (ref 3.5–5.2)
Alkaline Phosphatase: 70 U/L (ref 39–117)
BUN: 15 mg/dL (ref 6–23)
CO2: 26 mEq/L (ref 19–32)
Calcium: 9.5 mg/dL (ref 8.4–10.5)
Chloride: 104 mEq/L (ref 96–112)
Creatinine, Ser: 0.98 mg/dL (ref 0.40–1.20)
GFR: 56.13 mL/min — ABNORMAL LOW (ref >60.00)
Glucose, Bld: 98 mg/dL (ref 70–99)
Potassium: 3.9 mEq/L (ref 3.5–5.1)
Sodium: 138 mEq/L (ref 135–145)
Total Bilirubin: 1.1 mg/dL (ref 0.2–1.2)
Total Protein: 7.1 g/dL (ref 6.0–8.3)

## 2019-08-30 LAB — LIPID PANEL
Cholesterol: 185 mg/dL (ref 0–200)
HDL: 58.1 mg/dL (ref >39.00)
LDL Cholesterol: 103 mg/dL — ABNORMAL HIGH (ref 0–99)
NonHDL: 126.48
Total CHOL/HDL Ratio: 3
Triglycerides: 117 mg/dL (ref 0.0–149.0)
VLDL: 23.4 mg/dL (ref 0.0–40.0)

## 2019-08-30 LAB — HEMOGLOBIN A1C: Hgb A1c MFr Bld: 5.7 % (ref 4.6–6.5)

## 2019-08-30 LAB — TSH: TSH: 2.41 u[IU]/mL (ref 0.35–4.50)

## 2019-08-30 LAB — VITAMIN D 25 HYDROXY (VIT D DEFICIENCY, FRACTURES): VITD: 16.34 ng/mL — ABNORMAL LOW (ref 30.00–100.00)

## 2019-08-31 ENCOUNTER — Encounter: Payer: Self-pay | Admitting: Nurse Practitioner

## 2019-08-31 ENCOUNTER — Telehealth: Payer: Self-pay | Admitting: Nurse Practitioner

## 2019-08-31 ENCOUNTER — Telehealth (INDEPENDENT_AMBULATORY_CARE_PROVIDER_SITE_OTHER): Payer: Self-pay | Admitting: Gastroenterology

## 2019-08-31 ENCOUNTER — Other Ambulatory Visit: Payer: Self-pay

## 2019-08-31 DIAGNOSIS — Z800 Family history of malignant neoplasm of digestive organs: Secondary | ICD-10-CM

## 2019-08-31 DIAGNOSIS — Z1231 Encounter for screening mammogram for malignant neoplasm of breast: Secondary | ICD-10-CM | POA: Insufficient documentation

## 2019-08-31 DIAGNOSIS — K635 Polyp of colon: Secondary | ICD-10-CM | POA: Insufficient documentation

## 2019-08-31 DIAGNOSIS — Z86010 Personal history of colonic polyps: Secondary | ICD-10-CM

## 2019-08-31 DIAGNOSIS — L700 Acne vulgaris: Secondary | ICD-10-CM | POA: Insufficient documentation

## 2019-08-31 DIAGNOSIS — Z0000 Encounter for general adult medical examination without abnormal findings: Secondary | ICD-10-CM | POA: Insufficient documentation

## 2019-08-31 DIAGNOSIS — R7303 Prediabetes: Secondary | ICD-10-CM | POA: Insufficient documentation

## 2019-08-31 MED ORDER — VITAMIN D (ERGOCALCIFEROL) 1.25 MG (50000 UNIT) PO CAPS: 50000.0000 [IU] | ORAL_CAPSULE | ORAL | 0 refills | Status: DC

## 2019-08-31 MED ORDER — NA SULFATE-K SULFATE-MG SULF 17.5-3.13-1.6 GM/177ML PO SOLN: 1.0000 | Freq: Once | ORAL | 0 refills | Status: AC

## 2019-08-31 NOTE — Assessment & Plan Note (Signed)
Chemistry      Component Value Date/Time   NA 138 08/30/2019 0849   K 3.9 08/30/2019 0849   CL 104 08/30/2019 0849   CO2 26 08/30/2019 0849   BUN 15 08/30/2019 0849   CREATININE 0.98 08/30/2019 0849      Component Value Date/Time   CALCIUM 9.5 08/30/2019 0849   ALKPHOS 70 08/30/2019 0849   AST 12 08/30/2019 0849   ALT 11 08/30/2019 0849   BILITOT 1.1 08/30/2019 0849

## 2019-08-31 NOTE — Telephone Encounter (Signed)
Patient just started her BP medication yesterday so she has not had time to do her BP readings. She will call back Monday with readings.

## 2019-08-31 NOTE — Assessment & Plan Note (Signed)
She has a blackhead in the center of her back that she would like removed.  Referral to dermatology

## 2019-08-31 NOTE — Progress Notes (Signed)
Gastroenterology Pre-Procedure Review  Request Date: Tuesday 10/10/19 Requesting Physician: Dr. Allen Norris  PATIENT REVIEW QUESTIONS: The patient responded to the following health history questions as indicated:    1. Are you having any GI issues? no 2. Do you have a personal history of Polyps? yes (patient states her last colonoscopy was in 2000 not sure) 3. Do you have a family history of Colon Cancer or Polyps? yes (brother colon cancer, maternal grandmother colon cancer) 43. Diabetes Mellitus? no 5. Joint replacements in the past 12 months?no 6. Major health problems in the past 3 months?no 7. Any artificial heart valves, MVP, or defibrillator?no    MEDICATIONS & ALLERGIES:    Patient reports the following regarding taking any anticoagulation/antiplatelet therapy:   Plavix, Coumadin, Eliquis, Xarelto, Lovenox, Pradaxa, Brilinta, or Effient? no Aspirin? no  Patient confirms/reports the following medications:  Current Outpatient Medications  Medication Sig Dispense Refill  . albuterol (VENTOLIN HFA) 108 (90 Base) MCG/ACT inhaler Inhale 2 inhalations into the lungs every 6 (six) hours as needed forWheezing. 6.7 g 3  . amLODipine (NORVASC) 5 MG tablet Take 0.5 tablets (2.5 mg total) by mouth daily. 30 tablet 1  . fluticasone (FLOVENT HFA) 110 MCG/ACT inhaler Inhale 2 puffs into the lungs daily. 1 each 3  . loratadine (CLARITIN) 10 MG tablet Take 1 tablet (10 mg total) by mouth daily. 90 tablet 0  . Multiple Vitamin (MULTI-VITAMIN) tablet Take 1 tablet by mouth daily.    . Vitamin D, Ergocalciferol, (DRISDOL) 1.25 MG (50000 UNIT) CAPS capsule Take 1 capsule (50,000 Units total) by mouth every 7 (seven) days. 8 capsule 0   No current facility-administered medications for this visit.    Patient confirms/reports the following allergies:  No Known Allergies  No orders of the defined types were placed in this encounter.   AUTHORIZATION INFORMATION Primary Insurance: 1D#: Group  #:  Secondary Insurance: 1D#: Group #:  SCHEDULE INFORMATION: Date: 10/10/19 Time: Location:[facility]

## 2019-08-31 NOTE — Addendum Note (Signed)
Addended by: Denice Paradise A on: 08/31/2019 06:31 AM   Modules accepted: Orders

## 2019-08-31 NOTE — Telephone Encounter (Signed)
Please call her for update on her home BP readings with new start amlodipine 2.5 mg daily when you call about her lab work.    How is she feeling on this medication?  Please keep office visit  next week for recheck.

## 2019-08-31 NOTE — Assessment & Plan Note (Signed)
Refilled her inhalers.  Consider pulmonary consult for baseline PFTs.

## 2019-08-31 NOTE — Assessment & Plan Note (Signed)
Chart review shows elevated BP in office settings. BP 160/99 by machine and 170/100 by me: Begin very low dose amlodipine 2.5 mg one time daily. Check BP daily for 3 days and call in readings. See me in 2 weeks for visit.

## 2019-09-04 NOTE — Telephone Encounter (Signed)
Patient denies any sx; states she has been feeling fine

## 2019-09-04 NOTE — Telephone Encounter (Signed)
Called patient to get BP readings:  08/25 - 102/65 at 2:43 pm; 8:30 pm 113/64  08/26 - 8:30 am 113/69; 2:30 pm 139/88  08/27 - 11:18 am 126/72; 6:30 pm 104/68  08/28 - 8 am 105/62; 5:30 pm 106/66  08/29 - 5:20 pm 109/65

## 2019-09-04 NOTE — Telephone Encounter (Signed)
Any dizziness/lightheadedness/ CP/SOB/palpitations feeling like it is too low?

## 2019-09-12 ENCOUNTER — Encounter: Payer: Self-pay | Admitting: Nurse Practitioner

## 2019-09-12 ENCOUNTER — Other Ambulatory Visit: Payer: Self-pay

## 2019-09-12 ENCOUNTER — Telehealth (INDEPENDENT_AMBULATORY_CARE_PROVIDER_SITE_OTHER): Payer: Medicare HMO | Admitting: Nurse Practitioner

## 2019-09-12 ENCOUNTER — Telehealth: Payer: Self-pay

## 2019-09-12 VITALS — BP 144/83 | HR 98 | Ht 64.0 in | Wt 149.5 lb

## 2019-09-12 DIAGNOSIS — R7303 Prediabetes: Secondary | ICD-10-CM

## 2019-09-12 DIAGNOSIS — N1831 Chronic kidney disease, stage 3a: Secondary | ICD-10-CM

## 2019-09-12 DIAGNOSIS — M810 Age-related osteoporosis without current pathological fracture: Secondary | ICD-10-CM

## 2019-09-12 DIAGNOSIS — Z86010 Personal history of colonic polyps: Secondary | ICD-10-CM | POA: Diagnosis not present

## 2019-09-12 DIAGNOSIS — E559 Vitamin D deficiency, unspecified: Secondary | ICD-10-CM | POA: Insufficient documentation

## 2019-09-12 DIAGNOSIS — I10 Essential (primary) hypertension: Secondary | ICD-10-CM

## 2019-09-12 DIAGNOSIS — Z1159 Encounter for screening for other viral diseases: Secondary | ICD-10-CM

## 2019-09-12 NOTE — Assessment & Plan Note (Signed)
She has been tolerating amlodipine 2.5 mg daily well. She is recording her blood pressures at home. No dizziness lightheadedness or weakness. Blood pressures range  115/ 75-102/63 to low 98/63. She has been checking 3 times a day since she was placed on the medication. She was advised that she can do spot checks a few times a week. Let us know if she thinks her blood pressure is getting too low.

## 2019-09-12 NOTE — Telephone Encounter (Signed)
Received voice message from pt stating that she would like to cancel her colonoscopy.  Returned her call.  She states that she is uncomfortable having her colonoscopy at this time due to the pandemic.  Pt was informed that she may call the office back to rescheduled when she is comfortable.  Thank you,  Sharyn Lull, CMA

## 2019-09-12 NOTE — Addendum Note (Signed)
Addended by: Leeanne Rio on: 09/12/2019 09:32 AM   Modules accepted: Orders

## 2019-09-12 NOTE — Assessment & Plan Note (Addendum)
Bone density is set up for 09/18/2019. She has vitamin D deficiency. Her vitamin D level was 16. She was advised vitamin D supplementation 50,000 IU weekly. Patient declines. She is willing to do vitamin D 2000 IU daily. Advised to increase to 4000 IU daily. We will recheck vitamin D level, PTH in December. She reports a good calcium diet. And needs to take calcium supplement 1200 mg daily.

## 2019-09-12 NOTE — Patient Instructions (Addendum)
Bone density is set up for 09/18/2019. She has vitamin D deficiency. Her vitamin D level was 16. She was advised vitamin D supplementation 50,000 IU weekly. Patient declines. She is willing to do vitamin D 2000 IU daily. Advised to increase to 4000 IU daily. We will recheck vitamin D level, PTH in December. She reports a good calcium diet. And needs to take calcium supplement 1200 mg daily.  Proceed with colonoscopy for personal history of colon polyps.  Proceed with mammogram as arranged 09/18/2019.  Follow-up labs in December patient to schedule.  Addressed prediabetes with A1c 5.7. Weight is 225.66. Patient is to continue to eat a healthy diet, begin more regular exercise.   Osteoporosis  Osteoporosis is thinning and loss of density in your bones. Osteoporosis makes bones more brittle and fragile and more likely to break (fracture). Over time, osteoporosis can cause your bones to become so weak that they fracture after a minor fall. Bones in the hip, wrist, and spine are most likely to fracture due to osteoporosis. What are the causes? The exact cause of this condition is not known. What increases the risk? You may be at greater risk for osteoporosis if you:  Have a family history of the condition.  Have poor nutrition.  Use steroid medicines, such as prednisone.  Are female.  Are age 48 or older.  Smoke or have a history of smoking.  Are not physically active (are sedentary).  Are white (Caucasian) or of Asian descent.  Have a small body frame.  Take certain medicines, such as antiseizure medicines. What are the signs or symptoms? A fracture might be the first sign of osteoporosis, especially if the fracture results from a fall or injury that usually would not cause a bone to break. Other signs and symptoms include:  Pain in the neck or low back.  Stooped posture.  Loss of height. How is this diagnosed? This condition may be diagnosed based on:  Your medical  history.  A physical exam.  A bone mineral density test, also called a DXA or DEXA test (dual-energy X-ray absorptiometry test). This test uses X-rays to measure the amount of minerals in your bones. How is this treated? The goal of treatment is to strengthen your bones and lower your risk for a fracture. Treatment may involve:  Making lifestyle changes, such as: ? Including foods with more calcium and vitamin D in your diet. ? Doing weight-bearing and muscle-strengthening exercises. ? Stopping tobacco use. ? Limiting alcohol intake.  Taking medicine to slow the process of bone loss or to increase bone density.  Taking daily supplements of calcium and vitamin D.  Taking hormone replacement medicines, such as estrogen for women and testosterone for men.  Monitoring your levels of calcium and vitamin D. Follow these instructions at home:  Activity  Exercise as told by your health care provider. Ask your health care provider what exercises and activities are safe for you. You should do: ? Exercises that make you work against gravity (weight-bearing exercises), such as tai chi, yoga, or walking. ? Exercises to strengthen muscles, such as lifting weights. Lifestyle  Limit alcohol intake to no more than 1 drink a day for nonpregnant women and 2 drinks a day for men. One drink equals 12 oz of beer, 5 oz of wine, or 1 oz of hard liquor.  Do not use any products that contain nicotine or tobacco, such as cigarettes and e-cigarettes. If you need help quitting, ask your health care provider. Preventing  falls  Use devices to help you move around (mobility aids) as needed, such as canes, walkers, scooters, or crutches.  Keep rooms well-lit and clutter-free.  Remove tripping hazards from walkways, including cords and throw rugs.  Install grab bars in bathrooms and safety rails on stairs.  Use rubber mats in the bathroom and other areas that are often wet or slippery.  Wear closed-toe  shoes that fit well and support your feet. Wear shoes that have rubber soles or low heels.  Review your medicines with your health care provider. Some medicines can cause dizziness or changes in blood pressure, which can increase your risk of falling. General instructions  Include calcium and vitamin D in your diet. Calcium is important for bone health, and vitamin D helps your body to absorb calcium. Good sources of calcium and vitamin D include: ? Certain fatty fish, such as salmon and tuna. ? Products that have calcium and vitamin D added to them (fortified products), such as fortified cereals. ? Egg yolks. ? Cheese. ? Liver.  Take over-the-counter and prescription medicines only as told by your health care provider.  Keep all follow-up visits as told by your health care provider. This is important. Contact a health care provider if:  You have never been screened for osteoporosis and you are: ? A woman who is age 29 or older. ? A man who is age 21 or older. Get help right away if:  You fall or injure yourself. Summary  Osteoporosis is thinning and loss of density in your bones. This makes bones more brittle and fragile and more likely to break (fracture),even with minor falls.  The goal of treatment is to strengthen your bones and reduce your risk for a fracture.  Include calcium and vitamin D in your diet. Calcium is important for bone health, and vitamin D helps your body to absorb calcium.  Talk with your health care provider about screening for osteoporosis if you are a woman who is age 58 or older, or a man who is age 69 or older. This information is not intended to replace advice given to you by your health care provider. Make sure you discuss any questions you have with your health care provider. Document Revised: 12/04/2016 Document Reviewed: 10/16/2016 Elsevier Patient Education  2020 [place].  Prediabetes Eating Plan Prediabetes is a condition that causes blood  sugar (glucose) levels to be higher than normal. This increases the risk for developing diabetes. In order to prevent diabetes from developing, your health care provider may recommend a diet and other lifestyle changes to help you:  Control your blood glucose levels.  Improve your cholesterol levels.  Manage your blood pressure. Your health care provider may recommend working with a diet and nutrition specialist (dietitian) to make a meal plan that is best for you. What are tips for following this plan? Lifestyle  Set weight loss goals with the help of your health care team. It is recommended that most people with prediabetes lose 7% of their current body weight.  Exercise for at least 30 minutes at least 5 days a week.  Attend a support group or seek ongoing support from a mental health counselor.  Take over-the-counter and prescription medicines only as told by your health care provider. Reading food labels  Read food labels to check the amount of fat, salt (sodium), and sugar in prepackaged foods. Avoid foods that have: ? Saturated fats. ? Trans fats. ? Added sugars.  Avoid foods that have more  than 300 milligrams (mg) of sodium per serving. Limit your daily sodium intake to less than 2,300 mg each day. Shopping  Avoid buying pre-made and processed foods. Cooking  Cook with olive oil. Do not use butter, lard, or ghee.  Bake, broil, grill, or boil foods. Avoid frying. Meal planning   Work with your dietitian to develop an eating plan that is right for you. This may include: ? Tracking how many calories you take in. Use a food diary, notebook, or mobile application to track what you eat at each meal. ? Using the glycemic index (GI) to plan your meals. The index tells you how quickly a food will raise your blood glucose. Choose low-GI foods. These foods take a longer time to raise blood glucose.  Consider following a Mediterranean diet. This diet includes: ? Several servings  each day of fresh fruits and vegetables. ? Eating fish at least twice a week. ? Several servings each day of whole grains, beans, nuts, and seeds. ? Using olive oil instead of other fats. ? Moderate alcohol consumption. ? Eating small amounts of red meat and whole-fat dairy.  If you have high blood pressure, you may need to limit your sodium intake or follow a diet such as the DASH eating plan. DASH is an eating plan that aims to lower high blood pressure. What foods are recommended? The items listed below may not be a complete list. Talk with your dietitian about what dietary choices are best for you. Grains Whole grains, such as whole-wheat or whole-grain breads, crackers, cereals, and pasta. Unsweetened oatmeal. Bulgur. Barley. Quinoa. Brown rice. Corn or whole-wheat flour tortillas or taco shells. Vegetables Lettuce. Spinach. Peas. Beets. Cauliflower. Cabbage. Broccoli. Carrots. Tomatoes. Squash. Eggplant. Herbs. Peppers. Onions. Cucumbers. Brussels sprouts. Fruits Berries. Bananas. Apples. Oranges. Grapes. Papaya. Mango. Pomegranate. Kiwi. Grapefruit. Cherries. Meats and other protein foods Seafood. Poultry without skin. Lean cuts of pork and beef. Tofu. Eggs. Nuts. Beans. Dairy Low-fat or fat-free dairy products, such as yogurt, cottage cheese, and cheese. Beverages Water. Tea. Coffee. Sugar-free or diet soda. Seltzer water. Lowfat or no-fat milk. Milk alternatives, such as soy or almond milk. Fats and oils Olive oil. Canola oil. Sunflower oil. Grapeseed oil. Avocado. Walnuts. Sweets and desserts Sugar-free or low-fat pudding. Sugar-free or low-fat ice cream and other frozen treats. Seasoning and other foods Herbs. Sodium-free spices. Mustard. Relish. Low-fat, low-sugar ketchup. Low-fat, low-sugar barbecue sauce. Low-fat or fat-free mayonnaise. What foods are not recommended? The items listed below may not be a complete list. Talk with your dietitian about what dietary choices are  best for you. Grains Refined white flour and flour products, such as bread, pasta, snack foods, and cereals. Vegetables Canned vegetables. Frozen vegetables with butter or cream sauce. Fruits Fruits canned with syrup. Meats and other protein foods Fatty cuts of meat. Poultry with skin. Breaded or fried meat. Processed meats. Dairy Full-fat yogurt, cheese, or milk. Beverages Sweetened drinks, such as sweet iced tea and soda. Fats and oils Butter. Lard. Ghee. Sweets and desserts Baked goods, such as cake, cupcakes, pastries, cookies, and cheesecake. Seasoning and other foods Spice mixes with added salt. Ketchup. Barbecue sauce. Mayonnaise. Summary  To prevent diabetes from developing, you may need to make diet and other lifestyle changes to help control blood sugar, improve cholesterol levels, and manage your blood pressure.  Set weight loss goals with the help of your health care team. It is recommended that most people with prediabetes lose 7 percent of their current body weight.  Consider following a Mediterranean diet that includes plenty of fresh fruits and vegetables, whole grains, beans, nuts, seeds, fish, lean meat, low-fat dairy, and healthy oils. This information is not intended to replace advice given to you by your health care provider. Make sure you discuss any questions you have with your health care provider. Document Revised: 04/15/2018 Document Reviewed: 02/26/2016 Elsevier Patient Education  2020 [employer].  Vitamin D Deficiency Vitamin D deficiency is when your body does not have enough vitamin D. Vitamin D is important to your body for many reasons:  It helps the body absorb two important minerals--calcium and phosphorus.  It plays a role in bone health.  It may help to prevent some diseases, such as diabetes and multiple sclerosis.  It plays a role in muscle function, including heart function. If vitamin D deficiency is severe, it can cause a condition  in which your bones become soft. In adults, this condition is called osteomalacia. In children, this condition is called rickets. What are the causes? This condition may be caused by:  Not eating enough foods that contain vitamin D.  Not getting enough natural sun exposure.  Having certain digestive system diseases that make it difficult for your body to absorb vitamin D. These diseases include Crohn's disease, chronic pancreatitis, and cystic fibrosis.  Having a surgery in which a part of the stomach or a part of the small intestine is removed.  Having chronic kidney disease or liver disease. What increases the risk? You are more likely to develop this condition if you:  Are older.  Do not spend much time outdoors.  Live in a long-term care facility.  Have had broken bones.  Have weak or thin bones (osteoporosis).  Have a disease or condition that changes how the body absorbs vitamin D.  Have dark skin.  Take certain medicines, such as steroid medicines or certain seizure medicines.  Are overweight or obese. What are the signs or symptoms? In mild cases of vitamin D deficiency, there may not be any symptoms. If the condition is severe, symptoms may include:  Bone pain.  Muscle pain.  Falling often.  Broken bones caused by a minor injury. How is this diagnosed? This condition may be diagnosed with blood tests. Imaging tests such as X-rays may also be done to look for changes in the bone. How is this treated? Treatment for this condition may depend on what caused the condition. Treatment options include:  Taking vitamin D supplements. Your health care provider will suggest what dose is best for you.  Taking a calcium supplement. Your health care provider will suggest what dose is best for you. Follow these instructions at home: Eating and drinking   Eat foods that contain vitamin D. Choices include: ? Fortified dairy products, cereals, or juices. Fortified means  that vitamin D has been added to the food. Check the label on the package to see if the food is fortified. ? Fatty fish, such as salmon or trout. ? Eggs. ? Oysters. ? Mushrooms. The items listed above may not be a complete list of recommended foods and beverages. Contact a dietitian for more information. General instructions  Take medicines and supplements only as told by your health care provider.  Get regular, safe exposure to natural sunlight.  Do not use a tanning bed.  Maintain a healthy weight. Lose weight if needed.  Keep all follow-up visits as told by your health care provider. This is important. How is this prevented? You can get  vitamin D by:  Eating foods that naturally contain vitamin D.  Eating or drinking products that have been fortified with vitamin D, such as cereals, juices, and dairy products (including milk).  Taking a vitamin D supplement or a multivitamin supplement that contains vitamin D.  Being in the sun. Your body naturally makes vitamin D when your skin is exposed to sunlight. Your body changes the sunlight into a form of the vitamin that it can use. Contact a health care provider if:  Your symptoms do not go away.  You feel nauseous or you vomit.  You have fewer bowel movements than usual or are constipated. Summary  Vitamin D deficiency is when your body does not have enough vitamin D.  Vitamin D is important to your body for good bone health and muscle function, and it may help prevent some diseases.  Vitamin D deficiency is primarily treated through supplementation. Your health care provider will suggest what dose is best for you.  You can get vitamin D by eating foods that contain vitamin D, by being in the sun, and by taking a vitamin D supplement or a multivitamin supplement that contains vitamin D. This information is not intended to replace advice given to you by your health care provider. Make sure you discuss any questions you have  with your health care provider. Document Revised: 08/30/2017 Document Reviewed: 08/30/2017 Elsevier Patient Education  [employer].

## 2019-09-12 NOTE — Progress Notes (Signed)
Virtual Visit via Video Note  This visit type was conducted due to national recommendations for restrictions regarding the COVID-19 pandemic (e.g. social distancing).  This format is felt to be most appropriate for this patient at this time.  All issues noted in this document were discussed and addressed.  No physical exam was performed (except for noted visual exam findings with Video Visits).   I connected with@ on 09/12/19 at  8:30 AM EDT by a video enabled telemedicine application or telephone and verified that I am speaking with the correct person using two identifiers. Location patient: home Location provider: work or home office Persons participating in the virtual visit: patient, provider  I discussed the limitations, risks, security and privacy concerns of performing an evaluation and management service by telephone and the availability of in person appointments. I also discussed with the patient that there may be a patient responsible charge related to this service. The patient expressed understanding and agreed to proceed.  Reason for visit: Follow-up hypertension on new start amlodipine 2.5 mg daily.  HPI: Essential hypertension: Patient has a component of white coat syndrome. Chart review shows  chronic elevated blood pressures in the office. She was placed on low-dose amlodipine 2.5 mg last month. She has been checking her blood pressures at home. She is feeling well. She has noted no lightheadedness, dizziness, chest pain, pressure, heaviness, shortness of breath or DOE.  09/08/2019: A.m. 107/68 heart rate 97, 101/68 heart rate 79, BP 98/63 heart rate 97  09/09/2019: A.m. 115/75 heart rate 77, 102/63 heart rate 68,  09/10/2019 106/65, 109/57.   09/11/2019 103/70 a.m., 107/66   Today: 144/83 pulse 98.  Vitamin D deficiency: Laboratory work showed vitamin D of 16. She is not comfortable taking 50,000 units weekly. She has placed her self on vitamin D3 2000 IUs daily. We will recheck in  December.  Prediabetes: A1c 5.7. BMI 25.66. Patient advised healthy diet and taking exercise. We discussed exercise for bone density as well. Physical density scheduled for 09/18/2019.  Breast cancer screening: She has a mammogram scheduled for 09/18/2019.  Colon  Personal polyps: She declines colonoscopy at this time. She wants to wait until Covid is in better controlled.  She has had contact with [facility] and will contact them when she is ready to proceed. We discussed the best colon cancer screening is the one that she actually does. She voices understanding.   ROS: See pertinent positives and negatives per HPI.  Past Medical History:  Diagnosis Date  . Allergy   . Arthritis   . Asthma   . Breast cancer ([place]) 1990   RT MASTECTOMY  . Cancer ([place]) 2015   Uterine  . Chicken pox   . Chronic kidney disease   . History of uterine cancer 08/29/2019  . Personal history of chemotherapy 1990   BREAST CA  . Personal history of radiation therapy 2014   UTERINE CA    Past Surgical History:  Procedure Laterality Date  . ABDOMINAL HYSTERECTOMY    . APPENDECTOMY    . BREAST BIOPSY Right 1990   POS  . CESAREAN SECTION    . MASTECTOMY Right 1990   BREAST CA    Family History  Problem Relation Age of Onset  . Breast cancer Mother 53  . Arthritis Mother   . Cancer Mother   . Cancer Father   . Hypertension Father   . Cancer Brother   . Diabetes Son   . Hypertension Son  SOCIAL HX: Never smoked   Current Outpatient Medications:  .  albuterol (VENTOLIN HFA) 108 (90 Base) MCG/ACT inhaler, Inhale 2 inhalations into the lungs every 6 (six) hours as needed forWheezing., Disp: 6.7 g, Rfl: 3 .  amLODipine (NORVASC) 5 MG tablet, Take 0.5 tablets (2.5 mg total) by mouth daily., Disp: 30 tablet, Rfl: 1 .  Cholecalciferol (VITAMIN D) 50 MCG (2000 UT) CAPS, Take by mouth., Disp: , Rfl:  .  fluticasone (FLOVENT HFA) 110 MCG/ACT inhaler, Inhale 2 puffs into the lungs daily., Disp: 1  each, Rfl: 3 .  loratadine (CLARITIN) 10 MG tablet, Take 1 tablet (10 mg total) by mouth daily., Disp: 90 tablet, Rfl: 0 .  Multiple Vitamin (MULTI-VITAMIN) tablet, Take 1 tablet by mouth daily., Disp: , Rfl:   EXAM:  VITALS per patient if applicable: Weight 185.6 blood pressure 144/83 pulse 98  GENERAL: alert, oriented, appears well and in no acute distress  HEENT: atraumatic, conjunttiva clear, no obvious abnormalities on inspection of external nose and ears  NECK: normal movements of the head and neck  LUNGS: on inspection no signs of respiratory distress, breathing rate appears normal, no obvious gross SOB, gasping or wheezing  CV: no obvious cyanosis  MS: moves all visible extremities without noticeable abnormality  PSYCH/NEURO: pleasant and cooperative, no obvious depression or anxiety, speech and thought processing grossly intact  ASSESSMENT AND PLAN:  Discussed the following assessment and plan:  Essential hypertension - Plan: Basic metabolic panel  Pre-diabetes  Osteoporosis, unspecified osteoporosis type, unspecified pathological fracture presence  History of colon polyps  Vitamin D deficiency, unspecified - Plan: VITAMIN D 25 Hydroxy (Vit-D Deficiency, Fractures), PTH, intact (no Ca)  Stage 3a chronic kidney disease  Encounter for hepatitis C screening test for low risk patient - Plan: Hepatitis C Antibody  Polyp of colon She has been referred to [facility] and is  set up for colonoscopy. Patient wants to cancel it for now and re schedule in a few months when the Covid spike is lower. Patient advised to call the GI clinic as they may be able to set her up in a separate outpatient facility and not be involved in the [facility]. She was cautioned against people putting off necessary surveillance studies and then having poor outcomes. She voices understanding.   Essential hypertension She has been tolerating amlodipine 2.5 mg daily well. She is recording her blood  pressures at home. No dizziness lightheadedness or weakness. Blood pressures range  115/ 75-102/63 to low 98/63. She has been checking 3 times a day since she was placed on the medication. She was advised that she can do spot checks a few times a week. Let us know if she thinks her blood pressure is getting too low.  CKD (chronic kidney disease) stage 3, GFR 30-59 ml/min Follow-up Bmet in December  Osteoporosis Bone density is set up for 09/18/2019. She has vitamin D deficiency. Her vitamin D level was 16. She was advised vitamin D supplementation 50,000 IU weekly. Patient declines. She is willing to do vitamin D 2000 IU daily. Advised to increase to 4000 IU daily. We will recheck vitamin D level, PTH in December. She reports a good calcium diet. And needs to take calcium supplement 1200 mg daily.  Pre-diabetes Addressed prediabetes with A1c 5.7. Weight is 225.66. Patient is to continue to eat a healthy diet, begin more regular exercise.    I discussed the assessment and treatment plan with the patient. The patient was provided an opportunity to  ask questions and all were answered. The patient agreed with the plan and demonstrated an understanding of the instructions.   The patient was advised to call back or seek an in-person evaluation if the symptoms worsen or if the condition fails to improve as anticipated.  I provided  minutes of non-face-to-face time during this encounter. Denice Paradise, NP Adult Nurse Practitioner [place] [phone]

## 2019-09-12 NOTE — Assessment & Plan Note (Signed)
Follow-up Bmet in December

## 2019-09-12 NOTE — Assessment & Plan Note (Signed)
Addressed prediabetes with A1c 5.7. Weight is 225.66. Patient is to continue to eat a healthy diet, begin more regular exercise.

## 2019-09-12 NOTE — Assessment & Plan Note (Signed)
She has been referred to [facility] and is  set up for colonoscopy. Patient wants to cancel it for now and re schedule in a few months when the Covid spike is lower. Patient advised to call the GI clinic as they may be able to set her up in a separate outpatient facility and not be involved in the [facility]. She was cautioned against people putting off necessary surveillance studies and then having poor outcomes. She voices understanding.

## 2019-09-18 ENCOUNTER — Ambulatory Visit
Admission: RE | Admit: 2019-09-18 | Discharge: 2019-09-18 | Disposition: A | Discharge status: Home / Self Care | Payer: Medicare HMO | Source: Ambulatory Visit | Attending: Nurse Practitioner | Admitting: Nurse Practitioner

## 2019-09-18 ENCOUNTER — Other Ambulatory Visit: Payer: Self-pay

## 2019-09-18 DIAGNOSIS — Z1231 Encounter for screening mammogram for malignant neoplasm of breast: Secondary | ICD-10-CM

## 2019-09-18 DIAGNOSIS — M810 Age-related osteoporosis without current pathological fracture: Secondary | ICD-10-CM | POA: Diagnosis present

## 2019-09-19 ENCOUNTER — Other Ambulatory Visit: Payer: Self-pay | Admitting: Nurse Practitioner

## 2019-09-19 ENCOUNTER — Telehealth: Payer: Self-pay | Admitting: Nurse Practitioner

## 2019-09-19 ENCOUNTER — Encounter: Payer: Self-pay | Admitting: Nurse Practitioner

## 2019-09-19 DIAGNOSIS — R928 Other abnormal and inconclusive findings on diagnostic imaging of breast: Secondary | ICD-10-CM

## 2019-09-19 DIAGNOSIS — R921 Mammographic calcification found on diagnostic imaging of breast: Secondary | ICD-10-CM

## 2019-09-19 NOTE — Telephone Encounter (Signed)
I spoke to her about her mammo and dexa results and plan  of care.

## 2019-09-26 ENCOUNTER — Ambulatory Visit
Admission: RE | Admit: 2019-09-26 | Discharge: 2019-09-26 | Disposition: A | Discharge status: Home / Self Care | Payer: Medicare HMO | Source: Ambulatory Visit | Attending: Nurse Practitioner | Admitting: Nurse Practitioner

## 2019-09-26 DIAGNOSIS — R928 Other abnormal and inconclusive findings on diagnostic imaging of breast: Secondary | ICD-10-CM | POA: Diagnosis present

## 2019-09-26 DIAGNOSIS — R921 Mammographic calcification found on diagnostic imaging of breast: Secondary | ICD-10-CM | POA: Diagnosis present

## 2019-09-29 ENCOUNTER — Telehealth: Payer: Self-pay

## 2019-09-29 NOTE — Telephone Encounter (Signed)
LMTCB for MM results

## 2019-09-29 NOTE — Telephone Encounter (Signed)
Patient aware of results.

## 2019-10-10 ENCOUNTER — Ambulatory Visit: Admit: 2019-10-10 | Payer: Medicare HMO | Admitting: Gastroenterology

## 2019-10-10 SURGERY — COLONOSCOPY WITH PROPOFOL
Anesthesia: General

## 2019-10-24 ENCOUNTER — Encounter: Payer: Self-pay | Admitting: Nurse Practitioner

## 2019-10-27 ENCOUNTER — Ambulatory Visit: Payer: Medicare HMO

## 2019-11-29 ENCOUNTER — Telehealth: Payer: Self-pay | Admitting: Nurse Practitioner

## 2019-11-29 NOTE — Telephone Encounter (Signed)
Rejection Reason - Patient did not respond - Left message 08/30/19 - patient did not respond" [facility] said on Nov 29, 2019 4:17 PM

## 2019-12-18 ENCOUNTER — Telehealth: Payer: Self-pay | Admitting: Nurse Practitioner

## 2019-12-18 NOTE — Telephone Encounter (Signed)
Left message for patient to call back and schedule Medicare Annual Wellness Visit (AWV)  ° °This should be a telephone visit only=30 minutes. ° °No hx of AWV; please schedule at anytime with Denisa O'Brien-Blaney at [facility] [place] ° ° °

## 2020-02-14 DIAGNOSIS — Z0120 Encounter for dental examination and cleaning without abnormal findings: Secondary | ICD-10-CM | POA: Diagnosis not present

## 2020-09-18 DIAGNOSIS — H2513 Age-related nuclear cataract, bilateral: Secondary | ICD-10-CM | POA: Diagnosis not present

## 2020-10-03 ENCOUNTER — Encounter: Payer: Self-pay | Admitting: Internal Medicine

## 2020-10-03 ENCOUNTER — Ambulatory Visit (INDEPENDENT_AMBULATORY_CARE_PROVIDER_SITE_OTHER): Payer: Medicare HMO | Admitting: Internal Medicine

## 2020-10-03 ENCOUNTER — Other Ambulatory Visit: Payer: Self-pay

## 2020-10-03 VITALS — BP 155/86 | HR 94 | Temp 97.7°F | Resp 18 | Ht 64.0 in | Wt 142.8 lb

## 2020-10-03 DIAGNOSIS — M810 Age-related osteoporosis without current pathological fracture: Secondary | ICD-10-CM | POA: Diagnosis not present

## 2020-10-03 DIAGNOSIS — I10 Essential (primary) hypertension: Secondary | ICD-10-CM | POA: Diagnosis not present

## 2020-10-03 DIAGNOSIS — R7303 Prediabetes: Secondary | ICD-10-CM

## 2020-10-03 DIAGNOSIS — M19242 Secondary osteoarthritis, left hand: Secondary | ICD-10-CM

## 2020-10-03 DIAGNOSIS — F411 Generalized anxiety disorder: Secondary | ICD-10-CM

## 2020-10-03 DIAGNOSIS — D235 Other benign neoplasm of skin of trunk: Secondary | ICD-10-CM | POA: Diagnosis not present

## 2020-10-03 DIAGNOSIS — Z8542 Personal history of malignant neoplasm of other parts of uterus: Secondary | ICD-10-CM

## 2020-10-03 DIAGNOSIS — M19241 Secondary osteoarthritis, right hand: Secondary | ICD-10-CM | POA: Diagnosis not present

## 2020-10-03 DIAGNOSIS — J4540 Moderate persistent asthma, uncomplicated: Secondary | ICD-10-CM | POA: Diagnosis not present

## 2020-10-03 DIAGNOSIS — N1831 Chronic kidney disease, stage 3a: Secondary | ICD-10-CM

## 2020-10-03 DIAGNOSIS — Z853 Personal history of malignant neoplasm of breast: Secondary | ICD-10-CM | POA: Diagnosis not present

## 2020-10-03 DIAGNOSIS — M1990 Unspecified osteoarthritis, unspecified site: Secondary | ICD-10-CM | POA: Insufficient documentation

## 2020-10-03 DIAGNOSIS — R69 Illness, unspecified: Secondary | ICD-10-CM | POA: Diagnosis not present

## 2020-10-03 NOTE — Assessment & Plan Note (Signed)
Concerning for rheumatoid arthritis She has never had this worked up and is not interested in work-up at this time Okay to take Tylenol OTC as needed

## 2020-10-03 NOTE — Assessment & Plan Note (Signed)
Continue Calcium and Vitamin D Encouraged daily weightbearing exercise

## 2020-10-03 NOTE — Assessment & Plan Note (Signed)
Intermittent, not medicated Support up with

## 2020-10-03 NOTE — Assessment & Plan Note (Signed)
In remission, no longer follows with oncology

## 2020-10-03 NOTE — Assessment & Plan Note (Signed)
We will check A1c at [facility] wellness exam Encourage low-carb diet

## 2020-10-03 NOTE — Assessment & Plan Note (Signed)
She does not take Amlodipine as prescribed because blood pressures are much lower at home She monitors his daily Reinforced DASH diet We will continue to monitor

## 2020-10-03 NOTE — Assessment & Plan Note (Signed)
She is trying to wean off Flovent Continue Albuterol as needed

## 2020-10-03 NOTE — Progress Notes (Signed)
HPI  Pt presents to the clinic to establish care and for management of the conditions listed below. She is transferring care from Denice Paradise, NP.  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 She does not take any medications for this.  Asthma: Moderate, persistent. She denies chronic cough or shortness of breath. Managed on Flovent and Albuterol. She is trying to wean off the Flovent. There are no PFT's on file.  CKD 3: Her last creatinine was 0.98, GFR 56.13, 08/2019. She is not on an ACEI/ARB. She does not follow with nephrology.  White Coat HTN: Her BP today is 155/86. She reports her BP at home runs 105/70. She is not taking Amlodipine as prescribed. There is no ECG on file.  Hx of Breast/Uterine Cancer: In remission s/p bilateral mastectomy and hysterectomy. She had chemo for breast cancer, radiation for uterine cancer. She no longer follows with oncology.  GAD: Intermittent in social situations. She is not taking any medications for this. She is not seeing a therapist. She denies depression, SI/HI.  Osteoporosis: She is taking Calcium and Vit D OTC. She gets 30 minutes of weight bearing exercise daily. Bone density from 09/2019 reviewed.  Prediabetes: Her last A1C was 5.7, 08/2019. She is not taking any oral diabetic medication at this time. She does not check her sugars.  She has a blackhead in the center of her back. She would like referral to dermatology to have this removed.  Past Medical History:  Diagnosis Date   Allergy    Arthritis    Asthma    Breast cancer ([place]) 1990   RT MASTECTOMY   Cancer ([place]) 2015   Uterine   Chicken pox    Chronic kidney disease    History of uterine cancer 08/29/2019   Hypertension    Osteopenia    Personal history of chemotherapy 1990   BREAST CA   Personal history of radiation therapy 2014   UTERINE CA    Current Outpatient Medications  Medication Sig Dispense Refill   albuterol (VENTOLIN HFA) 108 (90 Base) MCG/ACT inhaler Inhale 2 inhalations into  the lungs every 6 (six) hours as needed forWheezing. 6.7 g 3   calcium carbonate (OSCAL) 1500 (600 Ca) MG TABS tablet Take 600 mg of elemental calcium by mouth daily with breakfast.     fluticasone (FLOVENT HFA) 110 MCG/ACT inhaler Inhale 2 puffs into the lungs daily. 1 each 3   loratadine (CLARITIN) 10 MG tablet Take 1 tablet (10 mg total) by mouth daily. 90 tablet 0   Multiple Vitamin (MULTI-VITAMIN) tablet Take 1 tablet by mouth daily.     amLODipine (NORVASC) 5 MG tablet Take 0.5 tablets (2.5 mg total) by mouth daily. 30 tablet 1   No current facility-administered medications for this visit.    No Known Allergies  Family History  Problem Relation Age of Onset   Diabetes Mother    Breast cancer Mother 5   Arthritis Mother    Cancer Mother    Cancer Father    Hypertension Father    Hodgkin's lymphoma Father    Cancer Brother    Diabetes Son    Hypertension Son     Social History   Socioeconomic History   Marital status: Married    Spouse name: Not on file   Number of children: Not on file   Years of education: Not on file   Highest education level: Not on file  Occupational History   Not on file  Tobacco Use   Smoking  status: Never   Smokeless tobacco: Never  Vaping Use   Vaping Use: Never used  Substance and Sexual Activity   Alcohol use: Yes    Alcohol/week: 1.0 standard drink    Types: 1 Glasses of wine per week    Comment: 1-2 month   Drug use: Never   Sexual activity: Yes  Other Topics Concern   Not on file  Social History Narrative   Not on file   Social Determinants of Health   Financial Resource Strain: Not on file  Food Insecurity: Not on file  Transportation Needs: Not on file  Physical Activity: Not on file  Stress: Not on file  Social Connections: Not on file  Intimate Partner Violence: Not on file    ROS:  Constitutional: Denies fever, malaise, fatigue, headache or abrupt weight changes.  HEENT: Denies eye pain, eye redness, ear pain,  ringing in the ears, wax buildup, runny nose, nasal congestion, bloody nose, or sore throat. Respiratory: Denies difficulty breathing, shortness of breath, cough or sputum production.   Cardiovascular: Denies chest pain, chest tightness, palpitations or swelling in the hands or feet.  Gastrointestinal: Denies abdominal pain, bloating, constipation, diarrhea or blood in the stool.  GU: Denies frequency, urgency, pain with urination, blood in urine, odor or discharge. Musculoskeletal: Pt reports joint pain. Denies decrease in range of motion, difficulty with gait, muscle pain or joint swelling.  Skin: Denies redness, rashes, lesions or ulcercations.  Neurological: Denies dizziness, difficulty with memory, difficulty with speech or problems with balance and coordination.  Psych: Pt has a history of social anxiety. Denies  depression, SI/HI.  No other specific complaints in a complete review of systems (except as listed in HPI above).  PE:  BP (!) 155/86 (BP Location: Right Arm, Patient Position: Sitting, Cuff Size: Normal)   Pulse 94   Temp 97.7 F (36.5 C) (Temporal)   Resp 18   Ht 5\' 4"  (1.626 m)   Wt 142 lb 12.8 oz (64.8 kg)   SpO2 100%   BMI 24.51 kg/m   Wt Readings from Last 3 Encounters:  09/12/19 149 lb 8 oz (67.8 kg)  08/29/19 149 lb (67.6 kg)    General: Appears her stated age, well developed, well nourished in NAD. Skin: Port of [place] noted of midline back.  HEENT: Head: normal shape and size; Eyes: sclera white EOMs intact;  Cardiovascular: Normal rate and rhythm. S1,S2 noted.  No murmur, rubs or gallops noted.  Pulmonary/Chest: Normal effort and positive vesicular breath sounds. No respiratory distress. No wheezes, rales or ronchi noted.  Musculoskeletal: Rheumatoid nodules noted of bilateral hands.  No difficulty with gait.  Neurological: Alert and oriented.  Psychiatric: Mood and affect normal. Behavior is normal. Judgment and thought content normal.     BMET     Component Value Date/Time   NA 138 08/30/2019 0849   K 3.9 08/30/2019 0849   CL 104 08/30/2019 0849   CO2 26 08/30/2019 0849   GLUCOSE 98 08/30/2019 0849   BUN 15 08/30/2019 0849   CREATININE 0.98 08/30/2019 0849   CALCIUM 9.5 08/30/2019 0849    Lipid Panel     Component Value Date/Time   CHOL 185 08/30/2019 0849   TRIG 117.0 08/30/2019 0849   HDL 58.10 08/30/2019 0849   CHOLHDL 3 08/30/2019 0849   VLDL 23.4 08/30/2019 0849   LDLCALC 103 (H) 08/30/2019 0849    CBC    Component Value Date/Time   WBC 7.6 08/30/2019 0849   RBC  4.52 08/30/2019 0849   HGB 12.9 08/30/2019 0849   HGB 13.8 11/28/2012 0829   HCT 39.5 08/30/2019 0849   HCT 32.8 (L) 12/06/2012 0542   PLT 222.0 08/30/2019 0849   MCV 87.3 08/30/2019 0849   MCHC 32.7 08/30/2019 0849   RDW 13.6 08/30/2019 0849   LYMPHSABS 2.0 08/30/2019 0849   MONOABS 0.8 08/30/2019 0849   EOSABS 0.1 08/30/2019 0849   BASOSABS 0.1 08/30/2019 0849    Hgb A1C Lab Results  Component Value Date   HGBA1C 5.7 08/30/2019     Assessment and Plan:  Port of [employer]:  Manual removal by this provider- see procedure note  Procedure Note:  Discussed risk of procedure including pain, bleeding and infection Informed consent obtained verbally Area cleansed with Betadine x 3 Area numbed with 1% Lidocaine with Epi- 1 ml Area incised with #13 blade Blackhead removed with tweezers Area cleansed with Betadine x 1 Covered with triple antibiotic ointment and bandaid Pt tolerated procedure well  RTC in 6 months, follow up chronic conditions Webb Silversmith, NP This visit occurred during the SARS-CoV-2 public health emergency.  Safety protocols were in place, including screening questions prior to the visit, additional usage of staff PPE, and extensive cleaning of exam room while observing appropriate contact time as indicated for disinfecting solutions.

## 2020-10-03 NOTE — Assessment & Plan Note (Signed)
In remission, no longer follows with urology

## 2020-10-03 NOTE — Assessment & Plan Note (Signed)
We will check kidney function at [facility] wellness exam

## 2020-10-03 NOTE — Patient Instructions (Signed)

## 2020-10-21 ENCOUNTER — Other Ambulatory Visit: Payer: Self-pay | Admitting: Internal Medicine

## 2020-10-21 DIAGNOSIS — Z1231 Encounter for screening mammogram for malignant neoplasm of breast: Secondary | ICD-10-CM

## 2020-11-12 ENCOUNTER — Other Ambulatory Visit: Payer: Self-pay | Admitting: Internal Medicine

## 2020-11-12 ENCOUNTER — Other Ambulatory Visit: Payer: Self-pay

## 2020-11-12 ENCOUNTER — Ambulatory Visit
Admission: RE | Admit: 2020-11-12 | Discharge: 2020-11-12 | Disposition: A | Discharge status: Home / Self Care | Payer: Medicare HMO | Source: Ambulatory Visit | Attending: Internal Medicine | Admitting: Internal Medicine

## 2020-11-12 DIAGNOSIS — Z1231 Encounter for screening mammogram for malignant neoplasm of breast: Secondary | ICD-10-CM | POA: Diagnosis not present

## 2021-05-20 ENCOUNTER — Ambulatory Visit (INDEPENDENT_AMBULATORY_CARE_PROVIDER_SITE_OTHER): Payer: Medicare HMO | Admitting: Internal Medicine

## 2021-05-20 ENCOUNTER — Encounter: Payer: Self-pay | Admitting: Internal Medicine

## 2021-05-20 VITALS — BP 154/88 | HR 90 | Temp 96.9°F | Ht 64.0 in | Wt 149.0 lb

## 2021-05-20 DIAGNOSIS — Z0001 Encounter for general adult medical examination with abnormal findings: Secondary | ICD-10-CM | POA: Diagnosis not present

## 2021-05-20 DIAGNOSIS — R7303 Prediabetes: Secondary | ICD-10-CM | POA: Diagnosis not present

## 2021-05-20 DIAGNOSIS — E663 Overweight: Secondary | ICD-10-CM | POA: Diagnosis not present

## 2021-05-20 DIAGNOSIS — Z6825 Body mass index (BMI) 25.0-25.9, adult: Secondary | ICD-10-CM

## 2021-05-20 DIAGNOSIS — I10 Essential (primary) hypertension: Secondary | ICD-10-CM

## 2021-05-20 DIAGNOSIS — Z23 Encounter for immunization: Secondary | ICD-10-CM | POA: Diagnosis not present

## 2021-05-20 MED ORDER — ALBUTEROL SULFATE HFA 108 (90 BASE) MCG/ACT IN AERS: INHALATION_SPRAY | RESPIRATORY_TRACT | 3 refills | Status: DC

## 2021-05-20 NOTE — Addendum Note (Signed)
Addended by: Ashley Royalty E on: 05/20/2021 11:12 AM ? ? Modules accepted: Orders ? ?

## 2021-05-20 NOTE — Assessment & Plan Note (Signed)
Elevated today ?She will return in 2 weeks for nurse visit BP check and will bring her home monitor for comparison ?

## 2021-05-20 NOTE — Patient Instructions (Signed)
Health Maintenance for Postmenopausal Women Menopause is a normal process in which your ability to get pregnant comes to an end. This process happens slowly over many months or years, usually between the ages of 48 and 55. Menopause is complete when you have missed your menstrual period for 12 months. It is important to talk with your health care provider about some of the most common conditions that affect women after menopause (postmenopausal women). These include heart disease, cancer, and bone loss (osteoporosis). Adopting a healthy lifestyle and getting preventive care can help to promote your health and wellness. The actions you take can also lower your chances of developing some of these common conditions. What are the signs and symptoms of menopause? During menopause, you may have the following symptoms: Hot flashes. These can be moderate or severe. Night sweats. Decrease in sex drive. Mood swings. Headaches. Tiredness (fatigue). Irritability. Memory problems. Problems falling asleep or staying asleep. Talk with your health care provider about treatment options for your symptoms. Do I need hormone replacement therapy? Hormone replacement therapy is effective in treating symptoms that are caused by menopause, such as hot flashes and night sweats. Hormone replacement carries certain risks, especially as you become older. If you are thinking about using estrogen or estrogen with progestin, discuss the benefits and risks with your health care provider. How can I reduce my risk for heart disease and stroke? The risk of heart disease, heart attack, and stroke increases as you age. One of the causes may be a change in the body's hormones during menopause. This can affect how your body uses dietary fats, triglycerides, and cholesterol. Heart attack and stroke are medical emergencies. There are many things that you can do to help prevent heart disease and stroke. Watch your blood pressure High  blood pressure causes heart disease and increases the risk of stroke. This is more likely to develop in people who have high blood pressure readings or are overweight. Have your blood pressure checked: Every 3-5 years if you are 18-39 years of age. Every year if you are 40 years old or older. Eat a healthy diet  Eat a diet that includes plenty of vegetables, fruits, low-fat dairy products, and lean protein. Do not eat a lot of foods that are high in solid fats, added sugars, or sodium. Get regular exercise Get regular exercise. This is one of the most important things you can do for your health. Most adults should: Try to exercise for at least 150 minutes each week. The exercise should increase your heart rate and make you sweat (moderate-intensity exercise). Try to do strengthening exercises at least twice each week. Do these in addition to the moderate-intensity exercise. Spend less time sitting. Even light physical activity can be beneficial. Other tips Work with your health care provider to achieve or maintain a healthy weight. Do not use any products that contain nicotine or tobacco. These products include cigarettes, chewing tobacco, and vaping devices, such as e-cigarettes. If you need help quitting, ask your health care provider. Know your numbers. Ask your health care provider to check your cholesterol and your blood sugar (glucose). Continue to have your blood tested as directed by your health care provider. Do I need screening for cancer? Depending on your health history and family history, you may need to have cancer screenings at different stages of your life. This may include screening for: Breast cancer. Cervical cancer. Lung cancer. Colorectal cancer. What is my risk for osteoporosis? After menopause, you may be   at increased risk for osteoporosis. Osteoporosis is a condition in which bone destruction happens more quickly than new bone creation. To help prevent osteoporosis or  the bone fractures that can happen because of osteoporosis, you may take the following actions: If you are 19-50 years old, get at least 1,000 mg of calcium and at least 600 international units (IU) of vitamin D per day. If you are older than age 50 but younger than age 70, get at least 1,200 mg of calcium and at least 600 international units (IU) of vitamin D per day. If you are older than age 70, get at least 1,200 mg of calcium and at least 800 international units (IU) of vitamin D per day. Smoking and drinking excessive alcohol increase the risk of osteoporosis. Eat foods that are rich in calcium and vitamin D, and do weight-bearing exercises several times each week as directed by your health care provider. How does menopause affect my mental health? Depression may occur at any age, but it is more common as you become older. Common symptoms of depression include: Feeling depressed. Changes in sleep patterns. Changes in appetite or eating patterns. Feeling an overall lack of motivation or enjoyment of activities that you previously enjoyed. Frequent crying spells. Talk with your health care provider if you think that you are experiencing any of these symptoms. General instructions See your health care provider for regular wellness exams and vaccines. This may include: Scheduling regular health, dental, and eye exams. Getting and maintaining your vaccines. These include: Influenza vaccine. Get this vaccine each year before the flu season begins. Pneumonia vaccine. Shingles vaccine. Tetanus, diphtheria, and pertussis (Tdap) booster vaccine. Your health care provider may also recommend other immunizations. Tell your health care provider if you have ever been abused or do not feel safe at home. Summary Menopause is a normal process in which your ability to get pregnant comes to an end. This condition causes hot flashes, night sweats, decreased interest in sex, mood swings, headaches, or lack  of sleep. Treatment for this condition may include hormone replacement therapy. Take actions to keep yourself healthy, including exercising regularly, eating a healthy diet, watching your weight, and checking your blood pressure and blood sugar levels. Get screened for cancer and depression. Make sure that you are up to date with all your vaccines. This information is not intended to replace advice given to you by your health care provider. Make sure you discuss any questions you have with your health care provider. Document Revised: 05/13/2020 Document Reviewed: 05/13/2020 Elsevier Patient Education  2023 [employer] Inc[employer]  

## 2021-05-20 NOTE — Progress Notes (Signed)
? ?Subjective:  ? ? Patient ID: Audrey Higgins, female    DOB: Sep 25, 1950, 71 y.o.   MRN: [id number] ? ?HPI ? ?Pt presents to the clinic today for her annual exam. ? ?Of note, her BP today is 154/88.  She reports history of whitecoat hypertension.  She reports her BP at home is 107/67.  She is not taking any antihypertensive medications at this time. ? ?Flu: 09/2020 ?Tetanus: 06/2016 ?Covid: Pfizer x 2 ?Pneumovax: 10/2013 ?Prevnar: never ?Shingrix: 03/2017, 09/2017 ?Pap smear: hysterectomy ?Mammogram: 11/2020 ?Bone density: 09/2019 ?Colon screening: never ?Vision screening: annually ?Dentist: biannually ? ?Diet: She does eat lean meat. She consumes some fruits and veggies. She does eat some fried foods. She drinks mostly water. ?Exercise: None ? ?Review of Systems ? ?Past Medical History:  ?Diagnosis Date  ? Allergy   ? Arthritis   ? Asthma   ? Breast cancer ([place]) 1990  ? RT MASTECTOMY  ? Cancer [facility]) 2015  ? Uterine  ? Chicken pox   ? Chronic kidney disease   ? History of uterine cancer 08/29/2019  ? Hypertension   ? Osteopenia   ? Personal history of chemotherapy 1990  ? BREAST CA  ? Personal history of radiation therapy 2014  ? UTERINE CA  ? ? ?Current Outpatient Medications  ?Medication Sig Dispense Refill  ? albuterol (VENTOLIN HFA) 108 (90 Base) MCG/ACT inhaler Inhale 2 inhalations into the lungs every 6 (six) hours as needed forWheezing. 6.7 g 3  ? amLODipine (NORVASC) 5 MG tablet Take 0.5 tablets (2.5 mg total) by mouth daily. 30 tablet 1  ? calcium carbonate (OSCAL) 1500 (600 Ca) MG TABS tablet Take 600 mg of elemental calcium by mouth daily with breakfast.    ? fluticasone (FLOVENT HFA) 110 MCG/ACT inhaler Inhale 2 puffs into the lungs daily. 1 each 3  ? loratadine (CLARITIN) 10 MG tablet Take 1 tablet (10 mg total) by mouth daily. 90 tablet 0  ? Multiple Vitamin (MULTI-VITAMIN) tablet Take 1 tablet by mouth daily.    ? ?No current facility-administered medications for this visit.  ? ? ?No Known  Allergies ? ?Family History  ?Problem Relation Age of Onset  ? Diabetes Mother   ? Breast cancer Mother 71  ? Arthritis Mother   ? Cancer Mother   ? Cancer Father   ? Hypertension Father   ? Hodgkin's lymphoma Father   ? Cancer Brother   ? Diabetes Son   ? Hypertension Son   ? ? ?Social History  ? ?Socioeconomic History  ? Marital status: Married  ?  Spouse name: Not on file  ? Number of children: Not on file  ? Years of education: Not on file  ? Highest education level: Not on file  ?Occupational History  ? Not on file  ?Tobacco Use  ? Smoking status: Never  ? Smokeless tobacco: Never  ?Vaping Use  ? Vaping Use: Never used  ?Substance and Sexual Activity  ? Alcohol use: Yes  ?  Alcohol/week: 1.0 standard drink  ?  Types: 1 Glasses of wine per week  ?  Comment: 1-2 month  ? Drug use: Never  ? Sexual activity: Yes  ?Other Topics Concern  ? Not on file  ?Social History Narrative  ? Not on file  ? ?Social Determinants of Health  ? ?Financial Resource Strain: Not on file  ?Food Insecurity: Not on file  ?Transportation Needs: Not on file  ?Physical Activity: Not on file  ?Stress: Not on file  ?  Social Connections: Not on file  ?Intimate Partner Violence: Not on file  ? ? ? ?Constitutional: Denies fever, malaise, fatigue, headache or abrupt weight changes.  ?HEENT: Denies eye pain, eye redness, ear pain, ringing in the ears, wax buildup, runny nose, nasal congestion, bloody nose, or sore throat. ?Respiratory: Denies difficulty breathing, shortness of breath, cough or sputum production.   ?Cardiovascular: Denies chest pain, chest tightness, palpitations or swelling in the hands or feet.  ?Gastrointestinal: Denies abdominal pain, bloating, constipation, diarrhea or blood in the stool.  ?GU: Denies urgency, frequency, pain with urination, burning sensation, blood in urine, odor or discharge. ?Musculoskeletal: Patient reports intermittent joint pain.  Denies decrease in range of motion, difficulty with gait, muscle pain or  joint swelling.  ?Skin: Denies redness, rashes, lesions or ulcercations.  ?Neurological: Denies dizziness, difficulty with memory, difficulty with speech or problems with balance and coordination.  ?Psych: Patient has a history of anxiety.  Denies depression, SI/HI. ? ?No other specific complaints in a complete review of systems (except as listed in HPI above). ? ?   ?Objective:  ? Physical Exam ? ?BP (!) 154/88 (BP Location: Left Arm, Patient Position: Sitting, Cuff Size: Normal)   Pulse 90   Temp (!) 96.9 ?F (36.1 ?C) (Temporal)   Ht 5' 4"  (1.626 m)   Wt 149 lb (67.6 kg)   SpO2 98%   BMI 25.58 kg/m?  ? ?Wt Readings from Last 3 Encounters:  ?10/03/20 142 lb 12.8 oz (64.8 kg)  ?09/12/19 149 lb 8 oz (67.8 kg)  ?08/29/19 149 lb (67.6 kg)  ? ? ?General: Appears her stated age, overweight, in NAD. ?Skin: Warm, dry and intact. No rashes, lesions or ulcerations noted. ?HEENT: Head: normal shape and size; Eyes: sclera white, no icterus, conjunctiva pink, PERRLA and EOMs intact;  ?Neck:  Neck supple, trachea midline. No masses, lumps or thyromegaly present.  ?Cardiovascular: Normal rate and rhythm. S1,S2 noted.  No murmur, rubs or gallops noted. No JVD or BLE edema. No carotid bruits noted. ?Pulmonary/Chest: Normal effort and positive vesicular breath sounds. No respiratory distress. No wheezes, rales or ronchi noted.  ?Abdomen: Soft and nontender. Normal bowel sounds. No distention or masses noted. Liver, spleen and kidneys non palpable. ?Musculoskeletal: Joint enlargement noted in hands.  Strength 5/5 BUE/BLE.  No difficulty with gait.  ?Neurological: Alert and oriented. Cranial nerves II-XII grossly intact. Coordination normal.  ?Psychiatric: Mood and affect normal. Behavior is normal. Judgment and thought content normal.  ? ? ? ?BMET ?   ?Component Value Date/Time  ? NA 138 08/30/2019 0849  ? K 3.9 08/30/2019 0849  ? CL 104 08/30/2019 0849  ? CO2 26 08/30/2019 0849  ? GLUCOSE 98 08/30/2019 0849  ? BUN 15  08/30/2019 0849  ? CREATININE 0.98 08/30/2019 0849  ? CALCIUM 9.5 08/30/2019 0849  ? ? ?Lipid Panel  ?   ?Component Value Date/Time  ? CHOL 185 08/30/2019 0849  ? TRIG 117.0 08/30/2019 0849  ? HDL 58.10 08/30/2019 0849  ? CHOLHDL 3 08/30/2019 0849  ? VLDL 23.4 08/30/2019 0849  ? LDLCALC 103 (H) 08/30/2019 0849  ? ? ?CBC ?   ?Component Value Date/Time  ? WBC 7.6 08/30/2019 0849  ? RBC 4.52 08/30/2019 0849  ? HGB 12.9 08/30/2019 0849  ? HGB 13.8 11/28/2012 0829  ? HCT 39.5 08/30/2019 0849  ? HCT 32.8 (L) 12/06/2012 0542  ? PLT 222.0 08/30/2019 0849  ? MCV 87.3 08/30/2019 0849  ? MCHC 32.7 08/30/2019 0849  ? RDW  13.6 08/30/2019 0849  ? LYMPHSABS 2.0 08/30/2019 0849  ? MONOABS 0.8 08/30/2019 0849  ? EOSABS 0.1 08/30/2019 0849  ? BASOSABS 0.1 08/30/2019 0849  ? ? ?Hgb A1C ?Lab Results  ?Component Value Date  ? HGBA1C 5.7 08/30/2019  ? ? ? ? ? ? ? ?   ?Assessment & Plan:  ? ?Preventative Health Maintenance: ? ?Encouraged her to get a flu shot in the fall ?Tetanus UTD ?Encouraged her to get a COVID booster ?Pneumovax UTD ?Prevnar 20 today ?Shingrix UTD ?She no longer needs Pap smears ?Mammogram due 11/2021 ?Bone density UTD ?She declines referral to GI for colonoscopy or Cologuard at this time ?Encouraged her to consume a balanced diet and exercise regimen ?Advised her to see an eye doctor and dentist annually ?We will check CBC, c-Met, lipid and A1c today ? ?RTC in 6 months, follow-up chronic conditions ?Webb Silversmith, NP ? ?

## 2021-05-20 NOTE — Assessment & Plan Note (Signed)
Encourage diet and exercise for weight loss 

## 2021-05-21 LAB — COMPREHENSIVE METABOLIC PANEL
AG Ratio: 1.5 (calc) (ref 1.0–2.5)
ALT: 13 U/L (ref 6–29)
AST: 15 U/L (ref 10–35)
Albumin: 4.5 g/dL (ref 3.6–5.1)
Alkaline phosphatase (APISO): 72 U/L (ref 37–153)
BUN/Creatinine Ratio: 15 (calc) (ref 6–22)
BUN: 16 mg/dL (ref 7–25)
CO2: 26 mmol/L (ref 20–32)
Calcium: 9.8 mg/dL (ref 8.6–10.4)
Chloride: 105 mmol/L (ref 98–110)
Creat: 1.1 mg/dL — ABNORMAL HIGH (ref 0.60–1.00)
Globulin: 3.1 g/dL (calc) (ref 1.9–3.7)
Glucose, Bld: 90 mg/dL (ref 65–139)
Potassium: 4.3 mmol/L (ref 3.5–5.3)
Sodium: 141 mmol/L (ref 135–146)
Total Bilirubin: 1 mg/dL (ref 0.2–1.2)
Total Protein: 7.6 g/dL (ref 6.1–8.1)

## 2021-05-21 LAB — LIPID PANEL
Cholesterol: 201 mg/dL — ABNORMAL HIGH (ref <200)
HDL: 66 mg/dL (ref >=50)
LDL Cholesterol (Calc): 109 mg/dL (calc) — ABNORMAL HIGH
Non-HDL Cholesterol (Calc): 135 mg/dL (calc) — ABNORMAL HIGH (ref <130)
Total CHOL/HDL Ratio: 3 (calc) (ref <5.0)
Triglycerides: 144 mg/dL (ref <150)

## 2021-05-21 LAB — CBC
HCT: 41.3 % (ref 35.0–45.0)
Hemoglobin: 13.7 g/dL (ref 11.7–15.5)
MCH: 29 pg (ref 27.0–33.0)
MCHC: 33.2 g/dL (ref 32.0–36.0)
MCV: 87.3 fL (ref 80.0–100.0)
MPV: 12 fL (ref 7.5–12.5)
Platelets: 256 10*3/uL (ref 140–400)
RBC: 4.73 10*6/uL (ref 3.80–5.10)
RDW: 12.5 % (ref 11.0–15.0)
WBC: 7.1 10*3/uL (ref 3.8–10.8)

## 2021-05-21 LAB — HEMOGLOBIN A1C
Hgb A1c MFr Bld: 5.5 % of total Hgb (ref <5.7)
Mean Plasma Glucose: 111 mg/dL
eAG (mmol/L): 6.2 mmol/L

## 2021-06-03 ENCOUNTER — Ambulatory Visit: Payer: Medicare HMO

## 2021-09-19 DIAGNOSIS — H2513 Age-related nuclear cataract, bilateral: Secondary | ICD-10-CM | POA: Diagnosis not present

## 2021-09-19 DIAGNOSIS — Z0100 Encounter for examination of eyes and vision without abnormal findings: Secondary | ICD-10-CM | POA: Diagnosis not present

## 2021-09-19 DIAGNOSIS — H25013 Cortical age-related cataract, bilateral: Secondary | ICD-10-CM | POA: Diagnosis not present

## 2021-10-20 ENCOUNTER — Encounter: Payer: Self-pay | Admitting: Internal Medicine

## 2021-10-20 ENCOUNTER — Ambulatory Visit (INDEPENDENT_AMBULATORY_CARE_PROVIDER_SITE_OTHER): Payer: Medicare HMO | Admitting: Internal Medicine

## 2021-10-20 VITALS — BP 156/96 | HR 78 | Temp 97.1°F | Wt 148.0 lb

## 2021-10-20 DIAGNOSIS — J4540 Moderate persistent asthma, uncomplicated: Secondary | ICD-10-CM

## 2021-10-20 DIAGNOSIS — Z8542 Personal history of malignant neoplasm of other parts of uterus: Secondary | ICD-10-CM | POA: Diagnosis not present

## 2021-10-20 DIAGNOSIS — R69 Illness, unspecified: Secondary | ICD-10-CM | POA: Diagnosis not present

## 2021-10-20 DIAGNOSIS — Z1231 Encounter for screening mammogram for malignant neoplasm of breast: Secondary | ICD-10-CM

## 2021-10-20 DIAGNOSIS — M19241 Secondary osteoarthritis, right hand: Secondary | ICD-10-CM | POA: Diagnosis not present

## 2021-10-20 DIAGNOSIS — F411 Generalized anxiety disorder: Secondary | ICD-10-CM

## 2021-10-20 DIAGNOSIS — N1831 Chronic kidney disease, stage 3a: Secondary | ICD-10-CM | POA: Diagnosis not present

## 2021-10-20 DIAGNOSIS — R7303 Prediabetes: Secondary | ICD-10-CM

## 2021-10-20 DIAGNOSIS — E663 Overweight: Secondary | ICD-10-CM | POA: Diagnosis not present

## 2021-10-20 DIAGNOSIS — Z853 Personal history of malignant neoplasm of breast: Secondary | ICD-10-CM

## 2021-10-20 DIAGNOSIS — M810 Age-related osteoporosis without current pathological fracture: Secondary | ICD-10-CM

## 2021-10-20 DIAGNOSIS — L301 Dyshidrosis [pompholyx]: Secondary | ICD-10-CM

## 2021-10-20 DIAGNOSIS — I10 Essential (primary) hypertension: Secondary | ICD-10-CM | POA: Diagnosis not present

## 2021-10-20 DIAGNOSIS — E7800 Pure hypercholesterolemia, unspecified: Secondary | ICD-10-CM | POA: Diagnosis not present

## 2021-10-20 DIAGNOSIS — M19242 Secondary osteoarthritis, left hand: Secondary | ICD-10-CM

## 2021-10-20 DIAGNOSIS — Z6825 Body mass index (BMI) 25.0-25.9, adult: Secondary | ICD-10-CM

## 2021-10-20 MED ORDER — MOMETASONE FUROATE 0.1 % EX CREA: 1.0000 | TOPICAL_CREAM | Freq: Every day | CUTANEOUS | 0 refills | Status: AC

## 2021-10-20 NOTE — Assessment & Plan Note (Signed)
Continue Flovent and albuterol 

## 2021-10-20 NOTE — Assessment & Plan Note (Signed)
In remission.

## 2021-10-20 NOTE — Progress Notes (Signed)
Subjective:    Patient ID: Audrey Higgins, female    DOB: Feb 25, 1950, 71 y.o.   MRN: [id number]  HPI  Patient presents to clinic today for follow-up of chronic conditions.  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  She is not taking any medications for this.  She does not follow with orthopedics.  Asthma: Moderate, persistent.  She denies chronic cough or shortness of breath.  She is using  Flovent and Albuterol as prescribed.  There are no PFTs on file.  CKD 3: Her last creatinine was 1.10, GFR not calculated.  She is not currently on an ACEI/ARB.  She does not follow with nephrology.  HTN: Her BP today is 154/98.  Her BP at home runs 100/60's.  She is not taking any antihypertensive medications at this time.  She feels like she has whitecoat HTN.  There is no ECG on file.  History of Breast/Uterine cancer: In remission status post bilateral mastectomy and hysterectomy, chemo and radiation.  She no longer follows with oncology.  GAD: Situational.  She is not taking any medications for this at this time.  She is not currently seeing a therapist.  She denies depression, SI/HI.  Osteoporosis: Managed with Calcium and Vitamin D OTC.  She gets weightbearing exercise daily.  Bone density from 09/2019 reviewed.  Prediabetes: Her last A1c was 5.5%, 05/2021.  She is not taking any oral diabetic medication at this time.  She does not check her sugars.  HLD: Her last LDL was 107, triglycerides 144, 05/2021.  She is not taking any cholesterol-lowering medication at this time.  She tries to consume a low-fat diet.  She also reports that the skin has been peeling from her fingers for the last 2 months.  She reports the peeling and cracking causes a burning sensation of her fingertips.  She has tried CeraVe OTC with minimal relief of symptoms.  Review of Systems     Past Medical History:  Diagnosis Date   Allergy    Arthritis    Asthma    Breast cancer ([place]) 1990   RT MASTECTOMY   Cancer ([place]) 2015    Uterine   Chicken pox    Chronic kidney disease    History of uterine cancer 08/29/2019   Hypertension    Osteopenia    Personal history of chemotherapy 1990   BREAST CA   Personal history of radiation therapy 2014   UTERINE CA    Current Outpatient Medications  Medication Sig Dispense Refill   albuterol (VENTOLIN HFA) 108 (90 Base) MCG/ACT inhaler Inhale 2 inhalations into the lungs every 6 (six) hours as needed forWheezing. 6.7 g 3   calcium carbonate (OSCAL) 1500 (600 Ca) MG TABS tablet Take 600 mg of elemental calcium by mouth daily with breakfast.     Calcium Carbonate-Vitamin D (CALTRATE 600+D PO) Take by mouth.     loratadine (CLARITIN) 10 MG tablet Take 1 tablet (10 mg total) by mouth daily. 90 tablet 0   No current facility-administered medications for this visit.    No Known Allergies  Family History  Problem Relation Age of Onset   Diabetes Mother    Breast cancer Mother 77   Arthritis Mother    Cancer Mother    Cancer Father    Hypertension Father    Hodgkin's lymphoma Father    Cancer Brother    Diabetes Son    Hypertension Son     Social History   Socioeconomic History   Marital status:  Married    Spouse name: Not on file   Number of children: Not on file   Years of education: Not on file   Highest education level: Not on file  Occupational History   Not on file  Tobacco Use   Smoking status: Never   Smokeless tobacco: Never  Vaping Use   Vaping Use: Never used  Substance and Sexual Activity   Alcohol use: Yes    Alcohol/week: 1.0 standard drink of alcohol    Types: 1 Glasses of wine per week    Comment: 1-2 month   Drug use: Never   Sexual activity: Yes  Other Topics Concern   Not on file  Social History Narrative   Not on file   Social Determinants of Health   Financial Resource Strain: Not on file  Food Insecurity: Not on file  Transportation Needs: Not on file  Physical Activity: Not on file  Stress: Not on file  Social  Connections: Not on file  Intimate Partner Violence: Not on file     Constitutional: Denies fever, malaise, fatigue, headache or abrupt weight changes.  HEENT: Denies eye pain, eye redness, ear pain, ringing in the ears, wax buildup, runny nose, nasal congestion, bloody nose, or sore throat. Respiratory: Denies difficulty breathing, shortness of breath, cough or sputum production.   Cardiovascular: Denies chest pain, chest tightness, palpitations or swelling in the hands or feet.  Gastrointestinal: Denies abdominal pain, bloating, constipation, diarrhea or blood in the stool.  GU: Denies urgency, frequency, pain with urination, burning sensation, blood in urine, odor or discharge. Musculoskeletal: Patient reports intermittent joint pain in hands.  Denies decrease in range of motion, difficulty with gait, muscle pain or joint swelling.  Skin: Patient reports peeling skin of fingers.  Denies redness, lesions or ulcercations.  Neurological: Denies dizziness, difficulty with memory, difficulty with speech or problems with balance and coordination.  Psych: Patient reports intermittent anxiety.  Denies depression, SI/HI.  No other specific complaints in a complete review of systems (except as listed in HPI above).  Objective:   Physical Exam  BP (!) 156/96 (BP Location: Left Arm, Patient Position: Sitting, Cuff Size: Normal)   Pulse 78   Temp (!) 97.1 F (36.2 C) (Temporal)   Wt 148 lb (67.1 kg)   SpO2 98%   BMI 25.40 kg/m   Wt Readings from Last 3 Encounters:  05/20/21 149 lb (67.6 kg)  10/03/20 142 lb 12.8 oz (64.8 kg)  09/12/19 149 lb 8 oz (67.8 kg)    General: Appears her stated age, overweight, in NAD. 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  Pulmonary/Chest: Normal effort and positive  vesicular breath sounds. No respiratory distress. No wheezes, rales or ronchi noted.  Musculoskeletal: Joint enlargement noted of hands.  No joint swelling noted.  No difficulty with gait.  Neurological: Alert and oriented.  Psychiatric: Mood and affect normal.  Mildly anxious appearing. Judgment and thought content normal.    BMET    Component Value Date/Time   NA 141 05/20/2021 1001   K 4.3 05/20/2021 1001   CL 105 05/20/2021 1001   CO2 26 05/20/2021 1001   GLUCOSE 90 05/20/2021 1001   BUN 16 05/20/2021 1001   CREATININE 1.10 (H) 05/20/2021 1001   CALCIUM 9.8 05/20/2021 1001    Lipid Panel  Component Value Date/Time   CHOL 201 (H) 05/20/2021 1001   TRIG 144 05/20/2021 1001   HDL 66 05/20/2021 1001   CHOLHDL 3.0 05/20/2021 1001   VLDL 23.4 08/30/2019 0849   LDLCALC 109 (H) 05/20/2021 1001    CBC    Component Value Date/Time   WBC 7.1 05/20/2021 1001   RBC 4.73 05/20/2021 1001   HGB 13.7 05/20/2021 1001   HGB 13.8 11/28/2012 0829   HCT 41.3 05/20/2021 1001   HCT 32.8 (L) 12/06/2012 0542   PLT 256 05/20/2021 1001   MCV 87.3 05/20/2021 1001   MCH 29.0 05/20/2021 1001   MCHC 33.2 05/20/2021 1001   RDW 12.5 05/20/2021 1001   LYMPHSABS 2.0 08/30/2019 0849   MONOABS 0.8 08/30/2019 0849   EOSABS 0.1 08/30/2019 0849   BASOSABS 0.1 08/30/2019 0849    Hgb A1C Lab Results  Component Value Date   HGBA1C 5.5 05/20/2021     '      Assessment & Plan:    RTC in 7 months for annual exam Webb Silversmith, NP

## 2021-10-20 NOTE — Assessment & Plan Note (Signed)
Advised her it was okay to take Tylenol arthritis as needed for joint pain

## 2021-10-20 NOTE — Patient Instructions (Signed)
Dyshidrotic Eczema Dyshidrotic eczema, also known as pompholyx, is a type of eczema that causes very itchy, fluid-filled blisters (vesicles) to form on the hands and feet. It is more common before age 72, though it can affect people of any age. There is no cure, but treatment and certain lifestyle changes can help relieve symptoms. What are the causes? The cause of this condition is not known. What increases the risk? You are more likely to develop this condition if: You wash your hands frequently. You have a personal or family history of eczema, allergies, asthma, or hay fever. You are allergic to metals, such as nickel or cobalt. You work with cement. You smoke. What are the signs or symptoms? Symptoms of this condition may affect the hands, the feet, or both. Symptoms may come and go (recur), and may include: Severe itching. This may happen before blisters appear. Blisters. These may form suddenly. In the early stages, blisters may form near the fingertips. In severe cases, blisters may grow to large blister masses (bullae). Blisters resolve in 2-3 weeks without bursting. This is followed by a dry phase in which itching eases. Pain and swelling. Cracks or long, narrow openings (fissures) in the skin. Severe dryness. Ridges on the nails. How is this diagnosed? This condition may be diagnosed based on: Your symptoms and a physical exam. Your medical history. Skin scrapings to rule out a fungal infection. Testing a swab of fluid for bacteria (culture). Removing a small piece of skin (biopsy) to test for infection or to rule out other conditions. Skin patch tests. These tests involve using patches that contain possible allergens and placing them on your back. Your health care provider will wait a few days and then check to see if an allergic reaction occurred. These tests may be done if your health care provider suspects allergic reactions, or to rule out other types of eczema. You may  be referred to a health care provider who specializes in skin conditions (dermatologist) to help diagnose and treat this condition. How is this treated? There is no cure for this condition, but treatment can help relieve symptoms. Depending on the amount and severity of the blisters, your health care provider may suggest: Avoiding allergens, irritants, or triggers that worsen symptoms. This may involve lifestyle changes, such as: Using different lotions or soaps. Avoiding hot weather or places that will cause you to sweat a lot. Managing stress with coping techniques, such as relaxation and exercise, and asking for help when you need it. Diet changes as recommended by your health care provider. Using a clean, damp towel (cool compress) to relieve symptoms. Soaking in a bath that contains a type of salt that relieves irritation (aluminum acetate soaks). Medicines, such as: Medicine taken by mouth to reduce itching (oral antihistamines). Medicine applied to the skin to reduce swelling and irritation (topical corticosteroids). Medicine that reduces the activity of the body's disease-fighting system (immunosuppressants) to treat inflammation. This may be given in severe cases. Antibiotic medicines to treat bacterial infection. Light therapy (phototherapy). This involves shining ultraviolet (UV) light on the affected skin in order to reduce itchiness and inflammation. Follow these instructions at home: Bathing and skin care  Wash skin gently. After bathing or washing your hands, pat your skin dry. Avoid rubbing your skin. Remove all jewelry before bathing. If the skin under the jewelry stays wet, blisters may form or get worse. Apply cool compresses as told by your health care provider. To do this: Soak a clean towel in  cool water. Wring out excess water until towel is damp. Place the towel over the affected skin. Leave the towel on for 20 minutes at a time, 2-3 times a day. Use mild soaps,  cleansers, and lotions that do not contain dyes, perfumes, or other irritants. Keep your skin hydrated. To do this: Avoid very hot water. Take lukewarm baths or showers. Apply moisturizer within 3 minutes of bathing. This locks in moisture. Medicines Take and apply over-the-counter and prescription medicines only as told by your health care provider. If you were prescribed an antibiotic medicine, take or apply it as told by your health care provider. Do not stop using the antibiotic even if you start to feel better. General instructions Do not use any products that contain nicotine or tobacco. These include cigarettes, chewing tobacco, and vaping devices, such as e-cigarettes. If you need help quitting, ask your health care provider. Identify and avoid triggers and allergens. Keep fingernails short to avoid breaking the skin while scratching. Use waterproof gloves to protect your hands when doing work that keeps your hands wet for a long time. Wear socks to keep your feet dry. Keep all follow-up visits. This is important. Contact a health care provider if: You have symptoms that do not go away. You have signs of infection, such as: Crusting, pus, or a bad smell. More redness, swelling, or pain. Increased warmth in the affected area. Get help right away if: Your skin gets streaking redness with associated pain. Summary Dyshidrotic eczema, also known as pompholyx, is a type of eczema that causes very itchy, fluid-filled blisters (vesicles) to form on the hands and feet. The cause of this condition is not known. There is no cure for this condition, but treatment can help relieve symptoms. Treatment depends on the amount and severity of the blisters. Use mild soaps, cleansers, and lotions that do not contain dyes, perfumes, or other irritants. Keep your skin hydrated. This information is not intended to replace advice given to you by your health care provider. Make sure you discuss any  questions you have with your health care provider. Document Revised: 10/02/2019 Document Reviewed: 10/02/2019 Elsevier Patient Education  2023 [employer]  

## 2021-10-20 NOTE — Assessment & Plan Note (Signed)
C-Met today 

## 2021-10-20 NOTE — Assessment & Plan Note (Signed)
Continue calcium and vitamin D daily Encourage daily weightbearing exercise She declines repeating her bone density at this time

## 2021-10-20 NOTE — Assessment & Plan Note (Signed)
We will trial mometasone ointment

## 2021-10-20 NOTE — Assessment & Plan Note (Signed)
A1c today Encourage low-carb diet and exercise for weight loss 

## 2021-10-20 NOTE — Assessment & Plan Note (Signed)
Discussed treating the numbers despite what she is getting at home however she would like to hold off at this time Reinforced DASH diet and exercise for weight loss We will continue to monitor

## 2021-10-20 NOTE — Assessment & Plan Note (Signed)
C-Met and lipid profile today Urged her to consume a low-fat diet

## 2021-10-20 NOTE — Assessment & Plan Note (Signed)
Encourage diet and exercise for weight loss 

## 2021-10-20 NOTE — Assessment & Plan Note (Signed)
Discussed starting antianxiety medication if she feels like the anxiety is what is contributing to her high blood pressure however she would like to hold off at this time Support offered

## 2021-10-21 LAB — COMPLETE METABOLIC PANEL WITH GFR
AG Ratio: 1.6 (calc) (ref 1.0–2.5)
ALT: 23 U/L (ref 6–29)
AST: 17 U/L (ref 10–35)
Albumin: 4.6 g/dL (ref 3.6–5.1)
Alkaline phosphatase (APISO): 68 U/L (ref 37–153)
BUN: 16 mg/dL (ref 7–25)
CO2: 26 mmol/L (ref 20–32)
Calcium: 10 mg/dL (ref 8.6–10.4)
Chloride: 105 mmol/L (ref 98–110)
Creat: 0.96 mg/dL (ref 0.60–1.00)
Globulin: 2.8 g/dL (calc) (ref 1.9–3.7)
Glucose, Bld: 90 mg/dL (ref 65–99)
Potassium: 4.2 mmol/L (ref 3.5–5.3)
Sodium: 140 mmol/L (ref 135–146)
Total Bilirubin: 1.1 mg/dL (ref 0.2–1.2)
Total Protein: 7.4 g/dL (ref 6.1–8.1)
eGFR: 63 mL/min/{1.73_m2} (ref >=60)

## 2021-10-21 LAB — LIPID PANEL
Cholesterol: 175 mg/dL (ref <200)
HDL: 69 mg/dL (ref >=50)
LDL Cholesterol (Calc): 87 mg/dL (calc)
Non-HDL Cholesterol (Calc): 106 mg/dL (calc) (ref <130)
Total CHOL/HDL Ratio: 2.5 (calc) (ref <5.0)
Triglycerides: 93 mg/dL (ref <150)

## 2021-11-12 ENCOUNTER — Ambulatory Visit: Payer: Medicare HMO | Admitting: Internal Medicine

## 2022-01-23 ENCOUNTER — Ambulatory Visit
Admission: RE | Admit: 2022-01-23 | Discharge: 2022-01-23 | Disposition: A | Discharge status: Home / Self Care | Payer: Medicare HMO | Source: Ambulatory Visit | Attending: Internal Medicine | Admitting: Internal Medicine

## 2022-01-23 DIAGNOSIS — Z1231 Encounter for screening mammogram for malignant neoplasm of breast: Secondary | ICD-10-CM | POA: Diagnosis not present

## 2022-02-12 IMAGING — MG DIGITAL SCREENING UNILAT LEFT W/ TOMO W/ CAD
4 series · 4 of 12 positions shown · non-contrast
Comparison: Previous exam(s).

CLINICAL DATA: Screening.

EXAM:
DIGITAL SCREENING UNILATERAL LEFT MAMMOGRAM WITH CAD AND TOMO

[L MLO synth-2D]
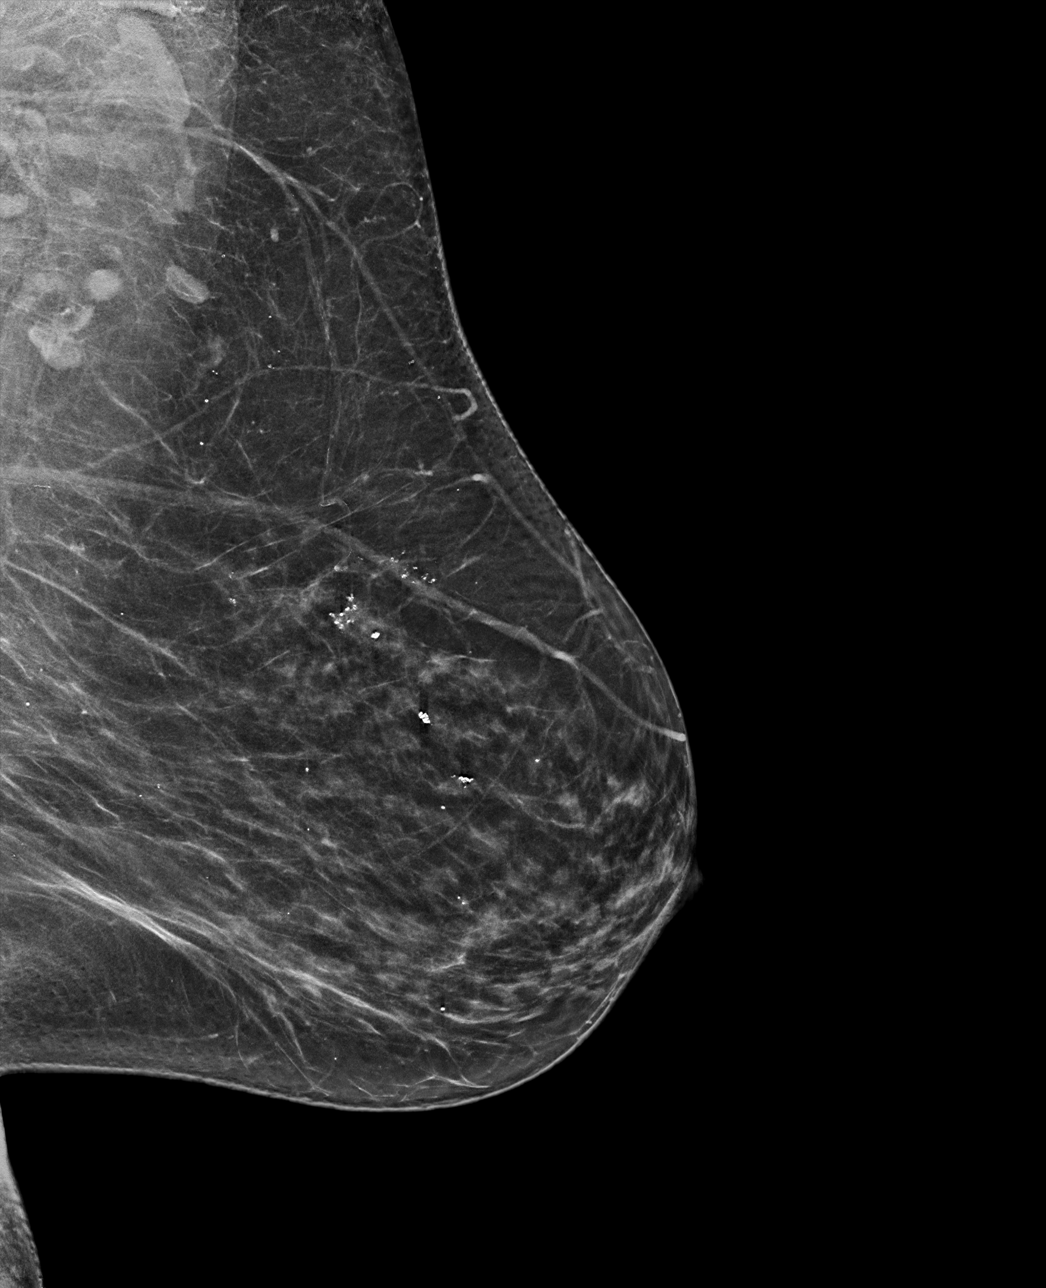

[L CC synth-2D]
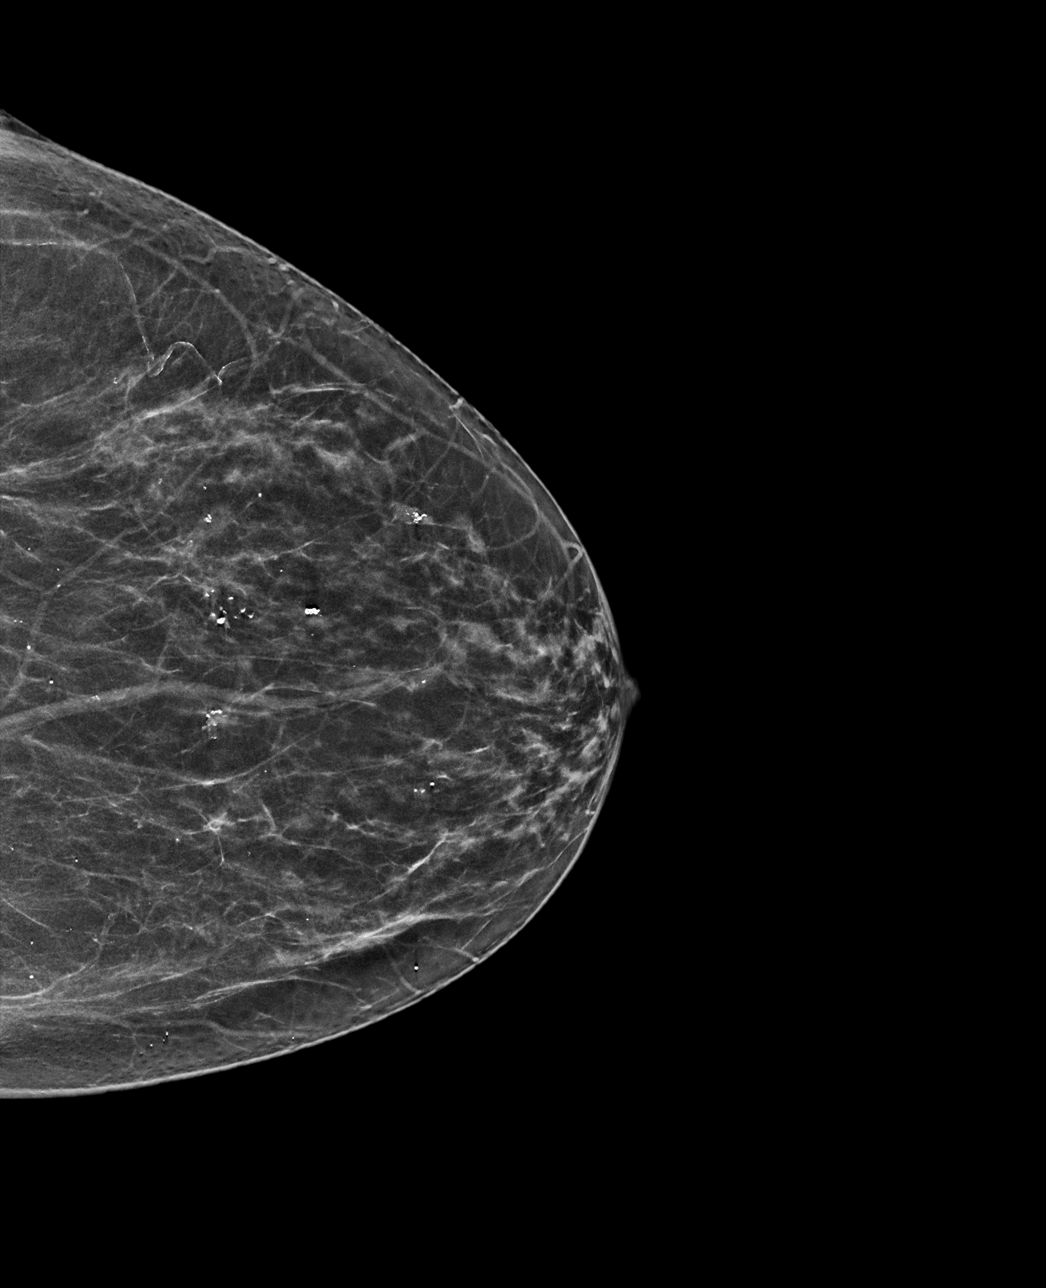

[L CC tomo · tomo slice 29/58.0]
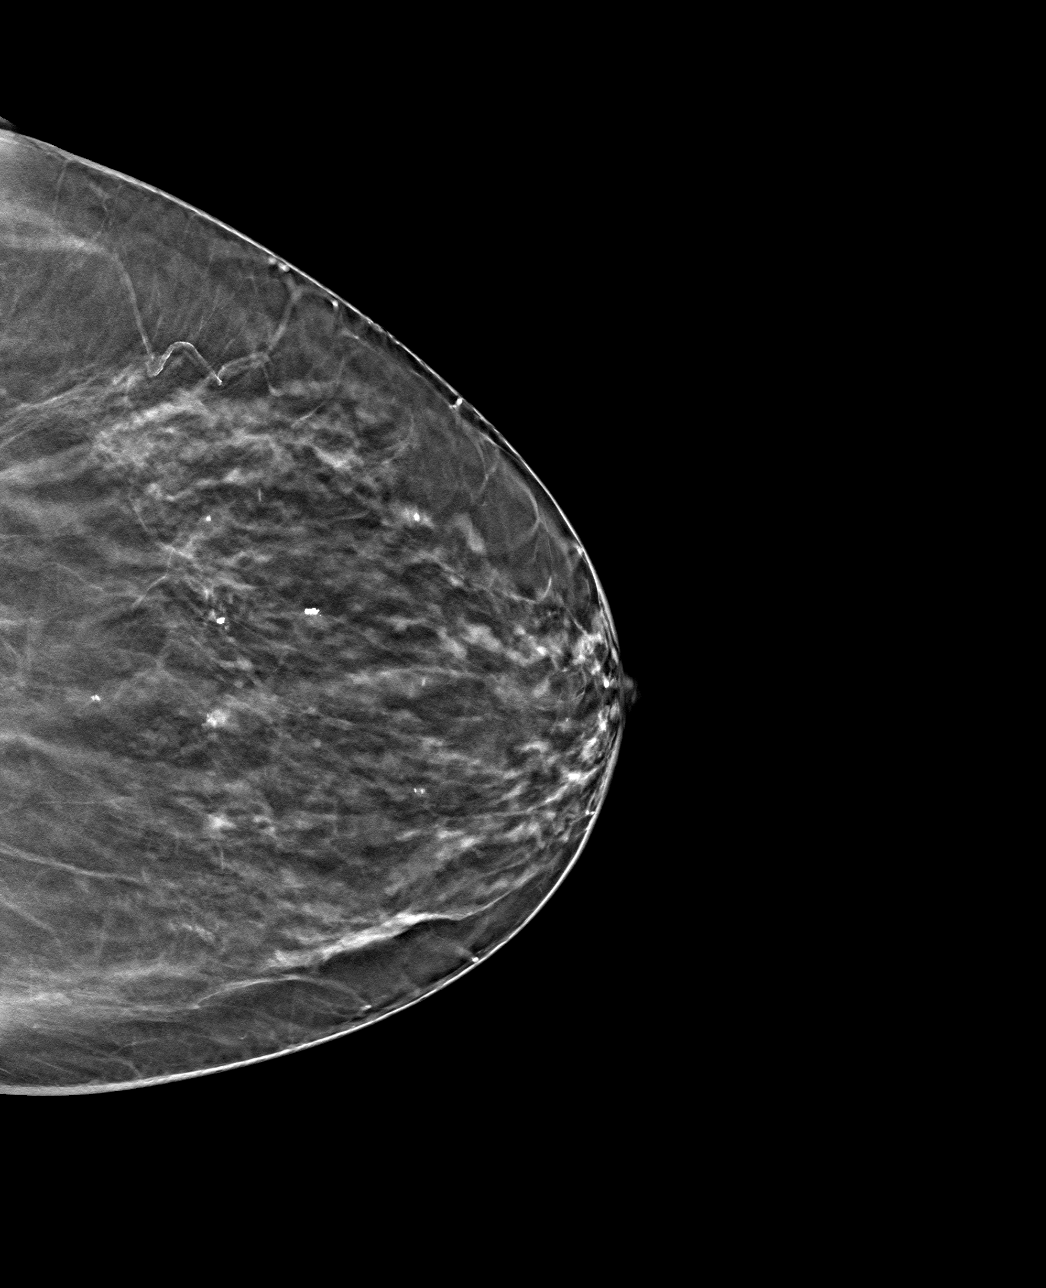

[L MLO tomo · tomo slice 33/65.0]
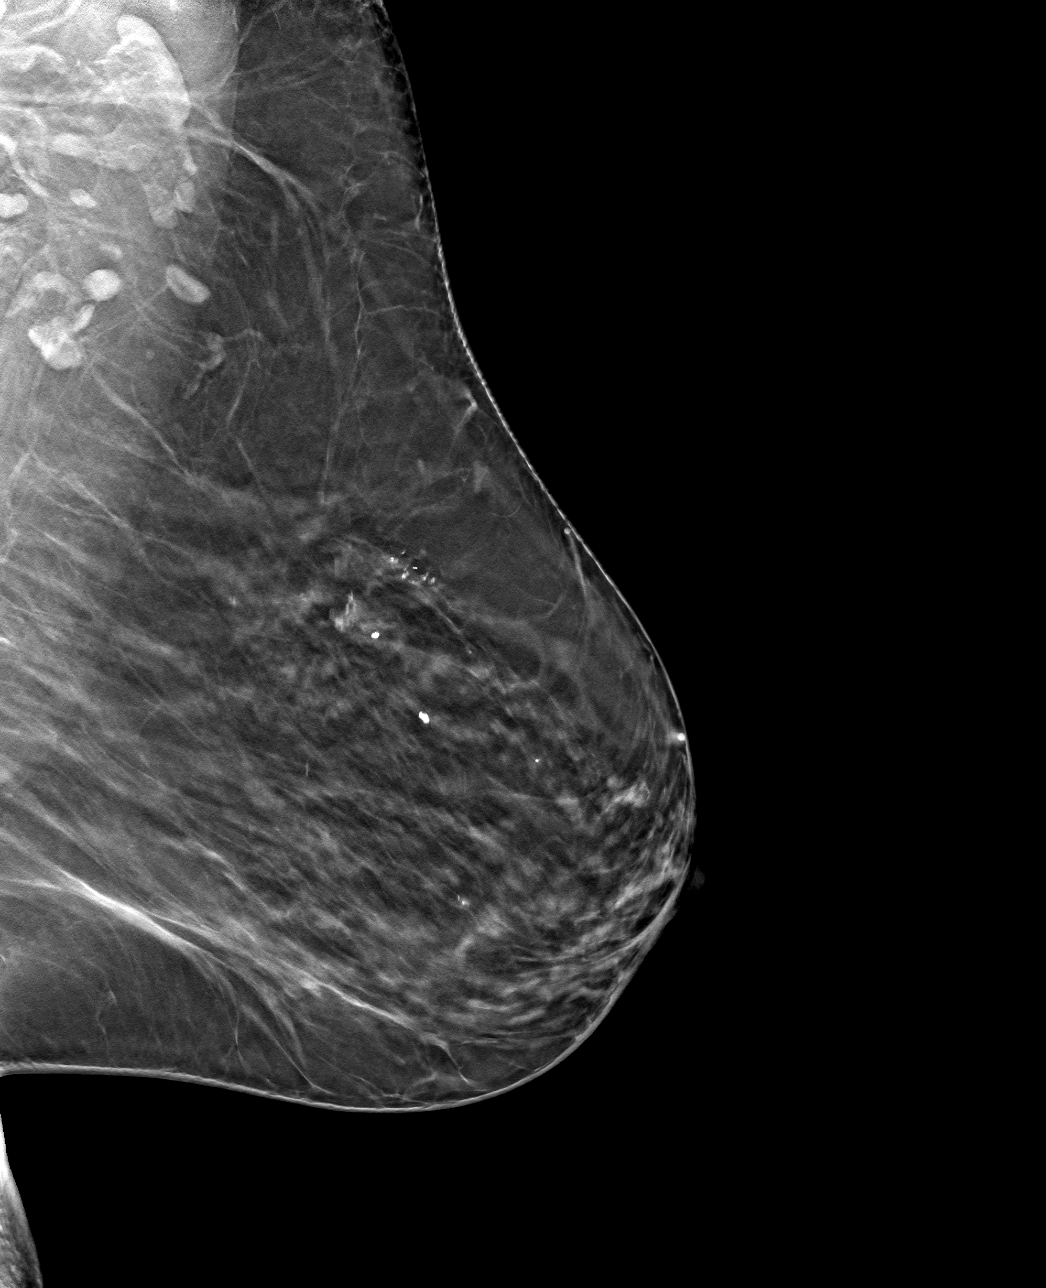

[4 of 12 positions shown; findings below may reference images not displayed]

ACR Breast Density Category b: There are scattered areas of
fibroglandular density.
FINDINGS: In the left breast, calcifications warrant further evaluation. The
patient is status post right mastectomy. Images were processed with
CAD.
IMPRESSION: Further evaluation is suggested for calcifications in the left
breast.

RECOMMENDATION:
Diagnostic mammogram of the left breast. (Code:[id number])

The patient will be contacted regarding the findings, and additional
imaging will be scheduled.

BI-RADS CATEGORY  0: Incomplete. Need additional imaging evaluation
and/or prior mammograms for comparison.

## 2022-03-21 ENCOUNTER — Ambulatory Visit
Admission: EM | Admit: 2022-03-21 | Discharge: 2022-03-21 | Disposition: A | Discharge status: Home / Self Care | Payer: Medicare HMO | Attending: Urgent Care | Admitting: Urgent Care

## 2022-03-21 DIAGNOSIS — L237 Allergic contact dermatitis due to plants, except food: Secondary | ICD-10-CM

## 2022-03-21 MED ORDER — CLOBETASOL PROPIONATE 0.05 % EX OINT: 1.0000 | TOPICAL_OINTMENT | Freq: Two times a day (BID) | CUTANEOUS | 0 refills | Status: DC

## 2022-03-21 MED ORDER — MUPIROCIN 2 % EX OINT: 1.0000 | TOPICAL_OINTMENT | Freq: Two times a day (BID) | CUTANEOUS | 0 refills | Status: DC

## 2022-03-21 NOTE — Discharge Instructions (Addendum)
Do not use your mometasone cream on this rash, I would prefer you use a steroid ointment called in today. Apply the clobetasol twice daily for up to 14 days.  Stop when symptoms resolve, or 14 days whichever is shorter. Do not occlude with a bandage. Only apply the Bactroban or mupirocin ointment to the areas that have a yellow crust.

## 2022-03-21 NOTE — ED Provider Notes (Signed)
Audrey Higgins    CSN: [id number] Arrival date & time: 03/21/22  1133      History   Chief Complaint Chief Complaint  Patient presents with   Poison Ivy    HPI Audrey Higgins is a 72 y.o. female.   Pleasant 72 year old female presents today due to concerns of poison ivy to her bilateral forearms.  States last weekend she was pulling weeds and developed the rash.  History of poison ivy in the past, states the rash is the same.  States that it has been itchy, and has been using topical Benadryl.  No other treatments tried.  She denies any warmth or redness surrounding the region, she is concerned because one of the areas is slightly darker and crusted compared to the rest.  She denies any rash anywhere else.  No systemic symptoms.   Poison Ivy    Past Medical History:  Diagnosis Date   Allergy    Arthritis    Asthma    Breast cancer ([place]) 1990   RT MASTECTOMY   Cancer ([place]) 2015   Uterine   Chicken pox    Chronic kidney disease    History of uterine cancer 08/29/2019   Hypertension    Osteopenia    Personal history of chemotherapy 1990   BREAST CA   Personal history of radiation therapy 2014   UTERINE CA    Patient Active Problem List   Diagnosis Date Noted   Pure hypercholesterolemia 10/20/2021   Dyshidrotic eczema 10/20/2021   Overweight with body mass index (BMI) of 25 to 25.9 in adult 05/20/2021   Osteoarthritis 10/03/2020   Pre-diabetes 08/31/2019   History of uterine cancer 08/29/2019   Osteoporosis 08/29/2019   GAD (generalized anxiety disorder) 04/27/2017   CKD (chronic kidney disease) stage 3, GFR 30-59 ml/min (HCC) 12/17/2014   White coat syndrome with hypertension 08/03/2013   Moderate persistent asthma without complication 0000000   Personal history of breast cancer 08/03/2013    Past Surgical History:  Procedure Laterality Date   ABDOMINAL HYSTERECTOMY     APPENDECTOMY     BREAST BIOPSY Right 1990   POS   CESAREAN SECTION      MASTECTOMY Right 1990   BREAST CA    OB History   No obstetric history on file.      Home Medications    Prior to Admission medications   Medication Sig Start Date End Date Taking? Authorizing Provider  clobetasol ointment (TEMOVATE) [id number] % Apply 1 Application topically 2 (two) times daily. Do not exceed 14 consecutive days of use 03/21/22  Yes Blane Worthington L, PA  mupirocin ointment (BACTROBAN) 2 % Apply 1 Application topically 2 (two) times daily. Only apply to areas with yellowish scabs 03/21/22  Yes Nada Godley L, PA  albuterol (VENTOLIN HFA) 108 (90 Base) MCG/ACT inhaler Inhale 2 inhalations into the lungs every 6 (six) hours as needed forWheezing. 05/20/21   Jearld Fenton, NP  Calcium Carbonate-Vitamin D (CALTRATE 600+D PO) Take by mouth.    [provider]  fluticasone (FLOVENT HFA) 110 MCG/ACT inhaler Inhale into the lungs 2 (two) times daily.    [provider]  loratadine (CLARITIN) 10 MG tablet Take 1 tablet (10 mg total) by mouth daily. 08/29/19   Marval Regal, NP  mometasone (ELOCON) 0.1 % cream Apply 1 Application topically daily. 10/20/21   Jearld Fenton, NP    Family History Family History  Problem Relation Age of Onset   Diabetes  Mother    Breast cancer Mother 48   Arthritis Mother    Cancer Mother    Cancer Father    Hypertension Father    Hodgkin's lymphoma Father    Cancer Brother    Diabetes Son    Hypertension Son     Social History Social History   Tobacco Use   Smoking status: Never   Smokeless tobacco: Never  Vaping Use   Vaping Use: Never used  Substance Use Topics   Alcohol use: Yes    Alcohol/week: 1.0 standard drink of alcohol    Types: 1 Glasses of wine per week    Comment: 1-2 month   Drug use: Never     Allergies   Patient has no known allergies.   Review of Systems Review of Systems As per HPI  Physical Exam Triage Vital Signs ED Triage Vitals  Enc Vitals Group     BP 03/21/22 1208 (!)  162/89     Pulse Rate 03/21/22 1208 92     Resp 03/21/22 1208 16     Temp 03/21/22 1208 97.8 F (36.6 C)     Temp Source 03/21/22 1208 Temporal     SpO2 03/21/22 1208 95 %     Weight --      Height --      Head Circumference --      Peak Flow --      Pain Score 03/21/22 1209 0     Pain Loc --      Pain Edu? --      Excl. in [place]? --    No data found.  Updated Vital Signs BP (!) 162/89 (BP Location: Left Arm)   Pulse 92   Temp 97.8 F (36.6 C) (Temporal)   Resp 16   SpO2 95%   Visual Acuity Right Eye Distance:   Left Eye Distance:   Bilateral Distance:    Right Eye Near:   Left Eye Near:    Bilateral Near:     Physical Exam Vitals and nursing note reviewed.  Constitutional:      General: She is not in acute distress.    Appearance: Normal appearance. She is normal weight. She is not ill-appearing or toxic-appearing.  HENT:     Head: Normocephalic and atraumatic.  Eyes:     General:        Right eye: No discharge.        Left eye: No discharge.     Pupils: Pupils are equal, round, and reactive to light.  Cardiovascular:     Rate and Rhythm: Normal rate.  Pulmonary:     Effort: Pulmonary effort is normal. No respiratory distress.  Skin:    General: Skin is warm and dry.     Coloration: Skin is not jaundiced.     Findings: Rash (B volar forearms with contact dermatitis, R>>>L. No discharge. One small dime-sized area to R forearm shows yellow crusting) present. No bruising.  Neurological:     General: No focal deficit present.     Mental Status: She is alert and oriented to person, place, and time.      UC Treatments / Results  Labs (all labs ordered are listed, but only abnormal results are displayed) Labs Reviewed - No data to display  EKG   Radiology No results found.  Procedures Procedures (including critical care time)  Medications Ordered in UC Medications - No data to display  Initial Impression / Assessment and Plan / UC Course  I have  reviewed the triage vital signs and the nursing notes.  Pertinent labs & imaging results that were available during my care of the patient were reviewed by me and considered in my medical decision making (see chart for details).     Contact dermatitis -will give topical mupirocin ointment for concerns of possible very mild developing impetigo to 1 area of the right forearm.  Otherwise, patient to use clobetasol ointment twice daily until resolved, but not to exceed 14 days.   Final Clinical Impressions(s) / UC Diagnoses   Final diagnoses:  Contact dermatitis due to poison ivy     Discharge Instructions      Do not use your mometasone cream on this rash, I would prefer you use a steroid ointment called in today. Apply the clobetasol twice daily for up to 14 days.  Stop when symptoms resolve, or 14 days whichever is shorter. Do not occlude with a bandage. Only apply the Bactroban or mupirocin ointment to the areas that have a yellow crust.   ED Prescriptions     Medication Sig Dispense Auth. Provider   clobetasol ointment (TEMOVATE) [id number] % Apply 1 Application topically 2 (two) times daily. Do not exceed 14 consecutive days of use 30 g Angelie Kram L, PA   mupirocin ointment (BACTROBAN) 2 % Apply 1 Application topically 2 (two) times daily. Only apply to areas with yellowish scabs 22 g Jianna Drabik L, PA      PDMP not reviewed this encounter.   Chaney Malling, [place] 03/21/22 1342

## 2022-03-21 NOTE — ED Triage Notes (Signed)
Patient presents to UC for poison ivy rash to right wrist area since 1 week. Applying benadryl. Concerned with infection.

## 2022-04-20 ENCOUNTER — Ambulatory Visit: Payer: Medicare HMO | Admitting: Internal Medicine

## 2022-04-21 ENCOUNTER — Encounter: Payer: Medicare HMO | Admitting: Internal Medicine

## 2022-04-22 ENCOUNTER — Telehealth: Payer: Self-pay

## 2022-04-22 DIAGNOSIS — R7303 Prediabetes: Secondary | ICD-10-CM

## 2022-04-22 DIAGNOSIS — E7800 Pure hypercholesterolemia, unspecified: Secondary | ICD-10-CM

## 2022-04-22 DIAGNOSIS — Z0000 Encounter for general adult medical examination without abnormal findings: Secondary | ICD-10-CM

## 2022-04-22 NOTE — Telephone Encounter (Signed)
Fasting lab appointment scheduled.  Attempted to reach patient but no answer or voicemail.

## 2022-04-22 NOTE — Telephone Encounter (Signed)
Copied from CRM [id number]. Topic: General - Other >> Apr 22, 2022 11:55 AM Dondra Prader A wrote: Reason for CRM: Pt husband Audrey Higgins is calling to see if pt can have her labs done 3-4 days before her physical on 05/22/22. Per Audrey Higgins if  you could place it in [place] so they can know. Please advise.

## 2022-04-22 NOTE — Telephone Encounter (Signed)
I do not routinely order labs prior to physicals however I will make an exception in her case.  Future labs ordered

## 2022-04-22 NOTE — Addendum Note (Signed)
Addended by: Lorre Munroe on: 04/22/2022 02:04 PM   Modules accepted: Orders

## 2022-05-19 ENCOUNTER — Other Ambulatory Visit: Payer: Medicare HMO

## 2022-05-19 DIAGNOSIS — E7800 Pure hypercholesterolemia, unspecified: Secondary | ICD-10-CM

## 2022-05-19 DIAGNOSIS — R7303 Prediabetes: Secondary | ICD-10-CM

## 2022-05-19 LAB — CBC
HCT: 39.3 % (ref 35.0–45.0)
Hemoglobin: 12.8 g/dL (ref 11.7–15.5)

## 2022-05-20 LAB — CBC
MCH: 28.3 pg (ref 27.0–33.0)
MCHC: 32.6 g/dL (ref 32.0–36.0)
MCV: 86.9 fL (ref 80.0–100.0)
MPV: 12.4 fL (ref 7.5–12.5)
Platelets: 255 10*3/uL (ref 140–400)
RBC: 4.52 10*6/uL (ref 3.80–5.10)
RDW: 12.5 % (ref 11.0–15.0)
WBC: 6 10*3/uL (ref 3.8–10.8)

## 2022-05-20 LAB — COMPLETE METABOLIC PANEL WITH GFR
AG Ratio: 1.6 (calc) (ref 1.0–2.5)
ALT: 12 U/L (ref 6–29)
AST: 11 U/L (ref 10–35)
Albumin: 4.5 g/dL (ref 3.6–5.1)
Alkaline phosphatase (APISO): 73 U/L (ref 37–153)
BUN: 14 mg/dL (ref 7–25)
CO2: 28 mmol/L (ref 20–32)
Calcium: 9.8 mg/dL (ref 8.6–10.4)
Chloride: 105 mmol/L (ref 98–110)
Creat: 0.97 mg/dL (ref 0.60–1.00)
Globulin: 2.9 g/dL (calc) (ref 1.9–3.7)
Glucose, Bld: 97 mg/dL (ref 65–99)
Potassium: 4.2 mmol/L (ref 3.5–5.3)
Sodium: 143 mmol/L (ref 135–146)
Total Bilirubin: 0.9 mg/dL (ref 0.2–1.2)
Total Protein: 7.4 g/dL (ref 6.1–8.1)
eGFR: 62 mL/min/{1.73_m2} (ref >=60)

## 2022-05-20 LAB — LIPID PANEL
Cholesterol: 168 mg/dL (ref <200)
HDL: 64 mg/dL (ref >=50)
LDL Cholesterol (Calc): 85 mg/dL (calc)
Non-HDL Cholesterol (Calc): 104 mg/dL (calc) (ref <130)
Total CHOL/HDL Ratio: 2.6 (calc) (ref <5.0)
Triglycerides: 92 mg/dL (ref <150)

## 2022-05-20 LAB — HEMOGLOBIN A1C
Hgb A1c MFr Bld: 5.7 % of total Hgb — ABNORMAL HIGH (ref <5.7)
Mean Plasma Glucose: 117 mg/dL
eAG (mmol/L): 6.5 mmol/L

## 2022-05-22 ENCOUNTER — Ambulatory Visit (INDEPENDENT_AMBULATORY_CARE_PROVIDER_SITE_OTHER): Payer: Medicare HMO | Admitting: Internal Medicine

## 2022-05-22 ENCOUNTER — Encounter: Payer: Self-pay | Admitting: Internal Medicine

## 2022-05-22 VITALS — BP 134/82 | HR 87 | Temp 95.6°F | Ht 64.0 in | Wt 140.0 lb

## 2022-05-22 DIAGNOSIS — Z0001 Encounter for general adult medical examination with abnormal findings: Secondary | ICD-10-CM | POA: Diagnosis not present

## 2022-05-22 MED ORDER — BUSPIRONE HCL 5 MG PO TABS: 5.0000 mg | ORAL_TABLET | Freq: Every day | ORAL | 0 refills | Status: DC | PRN

## 2022-05-22 MED ORDER — FLUTICASONE PROPIONATE HFA 110 MCG/ACT IN AERO: 1.0000 | INHALATION_SPRAY | Freq: Two times a day (BID) | RESPIRATORY_TRACT | 5 refills | Status: DC

## 2022-05-22 NOTE — Patient Instructions (Signed)
Health Maintenance for Postmenopausal Women Menopause is a normal process in which your ability to get pregnant comes to an end. This process happens slowly over many months or years, usually between the ages of 48 and 55. Menopause is complete when you have missed your menstrual period for 12 months. It is important to talk with your health care provider about some of the most common conditions that affect women after menopause (postmenopausal women). These include heart disease, cancer, and bone loss (osteoporosis). Adopting a healthy lifestyle and getting preventive care can help to promote your health and wellness. The actions you take can also lower your chances of developing some of these common conditions. What are the signs and symptoms of menopause? During menopause, you may have the following symptoms: Hot flashes. These can be moderate or severe. Night sweats. Decrease in sex drive. Mood swings. Headaches. Tiredness (fatigue). Irritability. Memory problems. Problems falling asleep or staying asleep. Talk with your health care provider about treatment options for your symptoms. Do I need hormone replacement therapy? Hormone replacement therapy is effective in treating symptoms that are caused by menopause, such as hot flashes and night sweats. Hormone replacement carries certain risks, especially as you become older. If you are thinking about using estrogen or estrogen with progestin, discuss the benefits and risks with your health care provider. How can I reduce my risk for heart disease and stroke? The risk of heart disease, heart attack, and stroke increases as you age. One of the causes may be a change in the body's hormones during menopause. This can affect how your body uses dietary fats, triglycerides, and cholesterol. Heart attack and stroke are medical emergencies. There are many things that you can do to help prevent heart disease and stroke. Watch your blood pressure High  blood pressure causes heart disease and increases the risk of stroke. This is more likely to develop in people who have high blood pressure readings or are overweight. Have your blood pressure checked: Every 3-5 years if you are 18-39 years of age. Every year if you are 40 years old or older. Eat a healthy diet  Eat a diet that includes plenty of vegetables, fruits, low-fat dairy products, and lean protein. Do not eat a lot of foods that are high in solid fats, added sugars, or sodium. Get regular exercise Get regular exercise. This is one of the most important things you can do for your health. Most adults should: Try to exercise for at least 150 minutes each week. The exercise should increase your heart rate and make you sweat (moderate-intensity exercise). Try to do strengthening exercises at least twice each week. Do these in addition to the moderate-intensity exercise. Spend less time sitting. Even light physical activity can be beneficial. Other tips Work with your health care provider to achieve or maintain a healthy weight. Do not use any products that contain nicotine or tobacco. These products include cigarettes, chewing tobacco, and vaping devices, such as e-cigarettes. If you need help quitting, ask your health care provider. Know your numbers. Ask your health care provider to check your cholesterol and your blood sugar (glucose). Continue to have your blood tested as directed by your health care provider. Do I need screening for cancer? Depending on your health history and family history, you may need to have cancer screenings at different stages of your life. This may include screening for: Breast cancer. Cervical cancer. Lung cancer. Colorectal cancer. What is my risk for osteoporosis? After menopause, you may be   at increased risk for osteoporosis. Osteoporosis is a condition in which bone destruction happens more quickly than new bone creation. To help prevent osteoporosis or  the bone fractures that can happen because of osteoporosis, you may take the following actions: If you are 19-50 years old, get at least 1,000 mg of calcium and at least 600 international units (IU) of vitamin D per day. If you are older than age 50 but younger than age 70, get at least 1,200 mg of calcium and at least 600 international units (IU) of vitamin D per day. If you are older than age 70, get at least 1,200 mg of calcium and at least 800 international units (IU) of vitamin D per day. Smoking and drinking excessive alcohol increase the risk of osteoporosis. Eat foods that are rich in calcium and vitamin D, and do weight-bearing exercises several times each week as directed by your health care provider. How does menopause affect my mental health? Depression may occur at any age, but it is more common as you become older. Common symptoms of depression include: Feeling depressed. Changes in sleep patterns. Changes in appetite or eating patterns. Feeling an overall lack of motivation or enjoyment of activities that you previously enjoyed. Frequent crying spells. Talk with your health care provider if you think that you are experiencing any of these symptoms. General instructions See your health care provider for regular wellness exams and vaccines. This may include: Scheduling regular health, dental, and eye exams. Getting and maintaining your vaccines. These include: Influenza vaccine. Get this vaccine each year before the flu season begins. Pneumonia vaccine. Shingles vaccine. Tetanus, diphtheria, and pertussis (Tdap) booster vaccine. Your health care provider may also recommend other immunizations. Tell your health care provider if you have ever been abused or do not feel safe at home. Summary Menopause is a normal process in which your ability to get pregnant comes to an end. This condition causes hot flashes, night sweats, decreased interest in sex, mood swings, headaches, or lack  of sleep. Treatment for this condition may include hormone replacement therapy. Take actions to keep yourself healthy, including exercising regularly, eating a healthy diet, watching your weight, and checking your blood pressure and blood sugar levels. Get screened for cancer and depression. Make sure that you are up to date with all your vaccines. This information is not intended to replace advice given to you by your health care provider. Make sure you discuss any questions you have with your health care provider. Document Revised: 05/13/2020 Document Reviewed: 05/13/2020 Elsevier Patient Education  2023 [employer] Inc[employer]  

## 2022-05-22 NOTE — Progress Notes (Signed)
Subjective:    Patient ID: Audrey Higgins, female    DOB: 10/20/1950, 72 y.o.   MRN: [id number]  HPI  Patient presents to clinic today for her annual exam.  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  She is having some anxiety for this and is wondering if there is anything she can take that is nonsedating prior to her appointment.  Flu: 09/2021 Tetanus: 06/2016 COVID: Pfizer x 2 Pneumovax: 10/2013 Prevnar: 05/2021 Shingrix: 03/2017, 09/2017 Pap smear: Hysterectomy Mammogram: 01/2022 Bone density: 09/2019 Colon screening: > 10 years ago Vision screening: annually Dentist: biannually  Diet: She does eat mostly lean meat. She consumes some fruits and veggies. She does eat some fried foods. She drinks mostly water and coffee. Exercise: Walking  Review of Systems     Past Medical History:  Diagnosis Date   Allergy    Arthritis    Asthma    Breast cancer (HCC) 1990   RT MASTECTOMY   Cancer (HCC) 2015   Uterine   Chicken pox    Chronic kidney disease    History of uterine cancer 08/29/2019   Hypertension    Osteopenia    Personal history of chemotherapy 1990   BREAST CA   Personal history of radiation therapy 2014   UTERINE CA    Current Outpatient Medications  Medication Sig Dispense Refill   albuterol (VENTOLIN HFA) 108 (90 Base) MCG/ACT inhaler Inhale 2 inhalations into the lungs every 6 (six) hours as needed forWheezing. 6.7 g 3   Calcium Carbonate-Vitamin D (CALTRATE 600+D PO) Take by mouth.     clobetasol ointment (TEMOVATE) 0.05 % Apply 1 Application topically 2 (two) times daily. Do not exceed 14 consecutive days of use 30 g 0   fluticasone (FLOVENT HFA) 110 MCG/ACT inhaler Inhale into the lungs 2 (two) times daily.     loratadine (CLARITIN) 10 MG tablet Take 1 tablet (10 mg total) by mouth daily. 90 tablet 0   mometasone (ELOCON) 0.1 % cream Apply 1 Application topically daily. 45 g 0   mupirocin ointment (BACTROBAN) 2 % Apply 1 Application  topically 2 (two) times daily. Only apply to areas with yellowish scabs 22 g 0   No current facility-administered medications for this visit.    No Known Allergies  Family History  Problem Relation Age of Onset   Diabetes Mother    Breast cancer Mother 44   Arthritis Mother    Cancer Mother    Cancer Father    Hypertension Father    Hodgkin's lymphoma Father    Cancer Brother    Diabetes Son    Hypertension Son     Social History   Socioeconomic History   Marital status: Married    Spouse name: Not on file   Number of children: Not on file   Years of education: Not on file   Highest education level: Associate degree: academic program  Occupational History   Not on file  Tobacco Use   Smoking status: Never   Smokeless tobacco: Never  Vaping Use   Vaping Use: Never used  Substance and Sexual Activity   Alcohol use: Yes    Alcohol/week: 1.0 standard drink of alcohol    Types: 1 Glasses of wine per week    Comment: 1-2 month   Drug use: Never   Sexual activity: Yes  Other Topics Concern   Not on file  Social History Narrative   Not on file   Social  Determinants of Health   Financial Resource Strain: Low Risk  (04/19/2022)   Overall Financial Resource Strain (CARDIA)    Difficulty of Paying Living Expenses: Not hard at all  Food Insecurity: No Food Insecurity (04/19/2022)   Hunger Vital Sign    Worried About Running Out of Food in the Last Year: Never true    Ran Out of Food in the Last Year: Never true  Transportation Needs: No Transportation Needs (04/19/2022)   PRAPARE - Administrator, Civil Service (Medical): No    Lack of Transportation (Non-Medical): No  Physical Activity: Insufficiently Active (04/19/2022)   Exercise Vital Sign    Days of Exercise per Week: 2 days    Minutes of Exercise per Session: 20 min  Stress: No Stress Concern Present (04/19/2022)   [employer] of Occupational Health - Occupational Stress Questionnaire    Feeling  of Stress : Only a little  Social Connections: Unknown (04/19/2022)   Social Connection and Isolation Panel [NHANES]    Frequency of Communication with Friends and Family: Once a week    Frequency of Social Gatherings with Friends and Family: Once a week    Attends Religious Services: Patient declined    Database administrator or Organizations: Patient declined    Attends Engineer, structural: Not on file    Marital Status: Married  Catering manager Violence: Not on file     Constitutional: Denies fever, malaise, fatigue, headache or abrupt weight changes.  HEENT: Denies eye pain, eye redness, ear pain, ringing in the ears, wax buildup, runny nose, nasal congestion, bloody nose, or sore throat. Respiratory: Denies difficulty breathing, shortness of breath, cough or sputum production.   Cardiovascular: Denies chest pain, chest tightness, palpitations or swelling in the hands or feet.  Gastrointestinal: Denies abdominal pain, bloating, constipation, diarrhea or blood in the stool.  GU: Denies urgency, frequency, pain with urination, burning sensation, blood in urine, odor or discharge. Musculoskeletal: Patient reports joint pain.  Denies decrease in range of motion, difficulty with gait, muscle pain or joint swelling.  Skin: Denies redness, rashes, lesions or ulcercations.  Neurological: Denies dizziness, difficulty with memory, difficulty with speech or problems with balance and coordination.  Psych: Patient has a history of anxiety.  Denies depression, SI/HI.  No other specific complaints in a complete review of systems (except as listed in HPI above).  Objective:   Physical Exam   BP 134/82 (BP Location: Left Arm, Patient Position: Sitting, Cuff Size: Normal)   Pulse 87   Temp (!) 95.6 F (35.3 C) (Temporal)   Ht 5\' 4"  (1.626 m)   Wt 140 lb (63.5 kg)   SpO2 96%   BMI 24.03 kg/m   Wt Readings from Last 3 Encounters:  10/20/21 148 lb (67.1 kg)  05/20/21 149 lb (67.6  kg)  10/03/20 142 lb 12.8 oz (64.8 kg)    General: Appears her stated age, well developed, well nourished in NAD. Skin: Warm, dry and intact.  HEENT: Head: normal shape and size; Eyes: sclera white, no icterus, conjunctiva pink, PERRLA and EOMs intact;  Neck:  Neck supple, trachea midline. No masses, lumps or thyromegaly present.  Cardiovascular: Normal rate and rhythm. S1,S2 noted.  No murmur, rubs or gallops noted. No JVD or BLE edema. No carotid bruits noted. Pulmonary/Chest: Normal effort and positive vesicular breath sounds. No respiratory distress. No wheezes, rales or ronchi noted.  Abdomen:  Normal bowel sounds.  Musculoskeletal: Dense nodes noted of bilateral fingers.  Strength 5/5 BUE/BLE.  No difficulty with gait.  Neurological: Alert and oriented. Cranial nerves II-XII grossly intact. Coordination normal.  Psychiatric: Mood and affect normal. Behavior is normal. Judgment and thought content normal.     BMET    Component Value Date/Time   NA 143 05/19/2022 0757   K 4.2 05/19/2022 0757   CL 105 05/19/2022 0757   CO2 28 05/19/2022 0757   GLUCOSE 97 05/19/2022 0757   BUN 14 05/19/2022 0757   CREATININE 0.97 05/19/2022 0757   CALCIUM 9.8 05/19/2022 0757    Lipid Panel     Component Value Date/Time   CHOL 168 05/19/2022 0757   TRIG 92 05/19/2022 0757   HDL 64 05/19/2022 0757   CHOLHDL 2.6 05/19/2022 0757   VLDL 23.4 08/30/2019 0849   LDLCALC 85 05/19/2022 0757    CBC    Component Value Date/Time   WBC 6.0 05/19/2022 0757   RBC 4.52 05/19/2022 0757   HGB 12.8 05/19/2022 0757   HGB 13.8 11/28/2012 0829   HCT 39.3 05/19/2022 0757   HCT 32.8 (L) 12/06/2012 0542   PLT 255 05/19/2022 0757   MCV 86.9 05/19/2022 0757   MCH 28.3 05/19/2022 0757   MCHC 32.6 05/19/2022 0757   RDW 12.5 05/19/2022 0757   LYMPHSABS 2.0 08/30/2019 0849   MONOABS 0.8 08/30/2019 0849   EOSABS 0.1 08/30/2019 0849   BASOSABS 0.1 08/30/2019 0849    Hgb A1C Lab Results  Component Value  Date   HGBA1C 5.7 (H) 05/19/2022           Assessment & Plan:   Preventative Health Maintenance:  Encouraged her to get a flu shot in the fall Tetanus UTD Encouraged her to get her COVID booster Pneumovax and Prevnar UTD Shingrix UTD She no longer needs to screen for cervical cancer Mammogram UTD Will order density at her follow-up appointment Colon screening declined Encouraged her to consume a balanced diet and exercise regimen Advised her to see an eye doctor and dentist annually Recent labs reviewed  RTC in 6 months, follow-up chronic conditions Nicki Reaper, NP

## 2022-05-25 ENCOUNTER — Encounter: Payer: Self-pay | Admitting: Internal Medicine

## 2022-05-25 MED ORDER — ARNUITY ELLIPTA 100 MCG/ACT IN AEPB: 1.0000 | INHALATION_SPRAY | Freq: Every day | RESPIRATORY_TRACT | 1 refills | Status: DC

## 2022-08-06 ENCOUNTER — Telehealth: Payer: Self-pay | Admitting: Internal Medicine

## 2022-08-06 NOTE — Telephone Encounter (Signed)
LM 08/06/2022 to schedule AWV   Audrey Higgins; Care Guide Ambulatory Clinical Support [place] [facility] Direct Dial: [phone]

## 2022-08-27 ENCOUNTER — Telehealth: Payer: Self-pay | Admitting: Internal Medicine

## 2022-08-27 NOTE — Telephone Encounter (Signed)
Copied from CRM [id number]. Topic: Medicare AWV >> Aug 27, 2022  9:41 AM Payton Doughty wrote: Reason for CRM: LM 08/27/2022 to schedule AWV   Verlee Rossetti; Care Guide Ambulatory Clinical Support [place] [facility] Direct Dial: [phone]

## 2022-10-20 DIAGNOSIS — M722 Plantar fascial fibromatosis: Secondary | ICD-10-CM

## 2022-10-20 HISTORY — DX: Plantar fascial fibromatosis: M72.2

## 2022-10-21 DIAGNOSIS — H2513 Age-related nuclear cataract, bilateral: Secondary | ICD-10-CM | POA: Diagnosis not present

## 2022-11-01 DIAGNOSIS — M722 Plantar fascial fibromatosis: Secondary | ICD-10-CM | POA: Diagnosis not present

## 2022-11-19 ENCOUNTER — Other Ambulatory Visit: Payer: Medicare HMO

## 2022-11-25 ENCOUNTER — Encounter: Payer: Self-pay | Admitting: Internal Medicine

## 2022-11-25 ENCOUNTER — Ambulatory Visit (INDEPENDENT_AMBULATORY_CARE_PROVIDER_SITE_OTHER): Payer: Medicare HMO | Admitting: Internal Medicine

## 2022-11-25 VITALS — BP 110/68 | HR 75 | Ht 64.0 in | Wt 142.4 lb

## 2022-11-25 DIAGNOSIS — M19242 Secondary osteoarthritis, left hand: Secondary | ICD-10-CM

## 2022-11-25 DIAGNOSIS — J4540 Moderate persistent asthma, uncomplicated: Secondary | ICD-10-CM

## 2022-11-25 DIAGNOSIS — Z853 Personal history of malignant neoplasm of breast: Secondary | ICD-10-CM

## 2022-11-25 DIAGNOSIS — Z8542 Personal history of malignant neoplasm of other parts of uterus: Secondary | ICD-10-CM

## 2022-11-25 DIAGNOSIS — E7800 Pure hypercholesterolemia, unspecified: Secondary | ICD-10-CM

## 2022-11-25 DIAGNOSIS — F411 Generalized anxiety disorder: Secondary | ICD-10-CM

## 2022-11-25 DIAGNOSIS — M722 Plantar fascial fibromatosis: Secondary | ICD-10-CM | POA: Diagnosis not present

## 2022-11-25 DIAGNOSIS — I10 Essential (primary) hypertension: Secondary | ICD-10-CM | POA: Diagnosis not present

## 2022-11-25 DIAGNOSIS — M19241 Secondary osteoarthritis, right hand: Secondary | ICD-10-CM

## 2022-11-25 DIAGNOSIS — M810 Age-related osteoporosis without current pathological fracture: Secondary | ICD-10-CM | POA: Diagnosis not present

## 2022-11-25 DIAGNOSIS — R7303 Prediabetes: Secondary | ICD-10-CM

## 2022-11-25 DIAGNOSIS — L301 Dyshidrosis [pompholyx]: Secondary | ICD-10-CM

## 2022-11-25 NOTE — Assessment & Plan Note (Signed)
No longer taking Arnuity Ellipta, will discontinue Continue albuterol as needed

## 2022-11-25 NOTE — Progress Notes (Signed)
Subjective:    Patient ID: Audrey Higgins, female    DOB: [birth year], 72 y.o.   MRN: [id number]  HPI  Patient presents to clinic today for follow-up of chronic conditions.  OA/plantar fasciitis: Mainly in her hands.  She takes voltaren gel as needed with good relief of symptoms.  She follows with orthopedics.  Asthma: Moderate, persistent.  She denies chronic cough or shortness of breath.  She is using albuterol as needed.  There are no PFTs on file.  HTN: Her BP today is 110/60.  She is not taking any antihypertensive medications at this time.    There is no ECG on file.  History of breast/uterine cancer: In remission status post bilateral mastectomy and hysterectomy, chemo and radiation.  She no longer follows with oncology.  GAD: Situational.  She is taking buspirone only as needed.  She is not currently seeing a therapist.  She denies depression, SI/HI.  Osteoporosis: Managed with calcium and vitamin d OTC.  She gets weightbearing exercise daily.  Bone density from 09/2019 reviewed.  Prediabetes: Her last A1c was 5.7%, 05/2022.  She is not taking any oral diabetic medication at this time.  She does not check her sugars.  HLD: Her last LDL was 85, triglycerides 92, 05/2022.  She is not taking any cholesterol-lowering medication at this time.  She tries to consume a low-fat diet.  Dyshidrotic eczema: Managed with mometasone ointment.  She does not follow with dermatology.  Review of Systems     Past Medical History:  Diagnosis Date   Allergy    Arthritis    Asthma    Breast cancer (HCC) 1990   RT MASTECTOMY   Cancer (HCC) 2015   Uterine   Chicken pox    Chronic kidney disease    History of uterine cancer 08/29/2019   Hypertension    Osteopenia    Personal history of chemotherapy 1990   BREAST CA   Personal history of radiation therapy 2014   UTERINE CA    Current Outpatient Medications  Medication Sig Dispense Refill   albuterol (VENTOLIN HFA) 108 (90 Base)  MCG/ACT inhaler Inhale 2 inhalations into the lungs every 6 (six) hours as needed forWheezing. 6.7 g 3   busPIRone (BUSPAR) 5 MG tablet Take 1 tablet (5 mg total) by mouth daily as needed. 30 tablet 0   Fluticasone Furoate (ARNUITY ELLIPTA) 100 MCG/ACT AEPB Inhale 1 puff into the lungs daily. 90 each 1   loratadine (CLARITIN) 10 MG tablet Take 1 tablet (10 mg total) by mouth daily. 90 tablet 0   mometasone (ELOCON) 0.1 % cream Apply 1 Application topically daily. 45 g 0   No current facility-administered medications for this visit.    No Known Allergies  Family History  Problem Relation Age of Onset   Diabetes Mother    Breast cancer Mother 12   Arthritis Mother    Cancer Mother    Hypertension Father    Hodgkin's lymphoma Father    Colon cancer Brother    Healthy Brother    Diabetes Son    Hypertension Son     Social History   Socioeconomic History   Marital status: Married    Spouse name: Not on file   Number of children: Not on file   Years of education: Not on file   Highest education level: Associate degree: occupational, Scientist, product/process development, or vocational program  Occupational History   Not on file  Tobacco Use   Smoking status: Never  Smokeless tobacco: Never  Vaping Use   Vaping status: Never Used  Substance and Sexual Activity   Alcohol use: Yes    Alcohol/week: 1.0 standard drink of alcohol    Types: 1 Glasses of wine per week    Comment: 1-2 month   Drug use: Never   Sexual activity: Yes  Other Topics Concern   Not on file  Social History Narrative   Not on file   Social Determinants of Health   Financial Resource Strain: Low Risk  (11/23/2022)   Overall Financial Resource Strain (CARDIA)    Difficulty of Paying Living Expenses: Not hard at all  Food Insecurity: No Food Insecurity (11/23/2022)   Hunger Vital Sign    Worried About Running Out of Food in the Last Year: Never true    Ran Out of Food in the Last Year: Never true  Transportation Needs: No  Transportation Needs (11/23/2022)   PRAPARE - Administrator, Civil Service (Medical): No    Lack of Transportation (Non-Medical): No  Physical Activity: Insufficiently Active (11/23/2022)   Exercise Vital Sign    Days of Exercise per Week: 4 days    Minutes of Exercise per Session: 20 min  Stress: No Stress Concern Present (11/23/2022)   [employer] of Occupational Health - Occupational Stress Questionnaire    Feeling of Stress : Not at all  Social Connections: Unknown (11/23/2022)   Social Connection and Isolation Panel [NHANES]    Frequency of Communication with Friends and Family: Once a week    Frequency of Social Gatherings with Friends and Family: Once a week    Attends Religious Services: Patient declined    Database administrator or Organizations: No    Attends Engineer, structural: Not on file    Marital Status: Married  Catering manager Violence: Not on file     Constitutional: Denies fever, malaise, fatigue, headache or abrupt weight changes.  HEENT: Denies eye pain, eye redness, ear pain, ringing in the ears, wax buildup, runny nose, nasal congestion, bloody nose, or sore throat. Respiratory: Denies difficulty breathing, shortness of breath, cough or sputum production.   Cardiovascular: Denies chest pain, chest tightness, palpitations or swelling in the hands or feet.  Gastrointestinal: Denies abdominal pain, bloating, constipation, diarrhea or blood in the stool.  GU: Denies urgency, frequency, pain with urination, burning sensation, blood in urine, odor or discharge. Musculoskeletal: Patient reports intermittent joint pain in hands, right heel pain.  Denies decrease in range of motion, difficulty with gait, muscle pain or joint swelling.  Skin: Patient reports peeling skin of fingers.  Denies redness, lesions or ulcercations.  Neurological: Denies dizziness, difficulty with memory, difficulty with speech or problems with balance and  coordination.  Psych: Patient reports intermittent anxiety.  Denies depression, SI/HI.  No other specific complaints in a complete review of systems (except as listed in HPI above).  Objective:   Physical Exam  BP 110/68   Pulse 75   Ht 5\' 4"  (1.626 m)   Wt 142 lb 6.4 oz (64.6 kg)   SpO2 98%   BMI 24.44 kg/m    Wt Readings from Last 3 Encounters:  05/22/22 140 lb (63.5 kg)  10/20/21 148 lb (67.1 kg)  05/20/21 149 lb (67.6 kg)    General: Appears her stated age, in NAD. Skin: Dyshidrotic eczema noted of second and third fingers of bilateral hands HEENT: Head: normal shape and size; Eyes: sclera white, no icterus, conjunctiva pink, PERRLA and EOMs  intact;  Cardiovascular: Normal rate and rhythm.  Pulmonary/Chest: Normal effort and positive vesicular breath sounds. No respiratory distress. No wheezes, rales or ronchi noted.  Musculoskeletal: Joint enlargement noted of hands.  Wearing brace to left wrist.  No difficulty with gait.  Neurological: Alert and oriented.  Psychiatric: Mood and affect normal.  Behavior is normal. Judgment and thought content normal.    BMET    Component Value Date/Time   NA 143 05/19/2022 0757   K 4.2 05/19/2022 0757   CL 105 05/19/2022 0757   CO2 28 05/19/2022 0757   GLUCOSE 97 05/19/2022 0757   BUN 14 05/19/2022 0757   CREATININE 0.97 05/19/2022 0757   CALCIUM 9.8 05/19/2022 0757    Lipid Panel     Component Value Date/Time   CHOL 168 05/19/2022 0757   TRIG 92 05/19/2022 0757   HDL 64 05/19/2022 0757   CHOLHDL 2.6 05/19/2022 0757   VLDL 23.4 08/30/2019 0849   LDLCALC 85 05/19/2022 0757    CBC    Component Value Date/Time   WBC 6.0 05/19/2022 0757   RBC 4.52 05/19/2022 0757   HGB 12.8 05/19/2022 0757   HGB 13.8 11/28/2012 0829   HCT 39.3 05/19/2022 0757   HCT 32.8 (L) 12/06/2012 0542   PLT 255 05/19/2022 0757   MCV 86.9 05/19/2022 0757   MCH 28.3 05/19/2022 0757   MCHC 32.6 05/19/2022 0757   RDW 12.5 05/19/2022 0757    LYMPHSABS 2.0 08/30/2019 0849   MONOABS 0.8 08/30/2019 0849   EOSABS 0.1 08/30/2019 0849   BASOSABS 0.1 08/30/2019 0849    Hgb A1C Lab Results  Component Value Date   HGBA1C 5.7 (H) 05/19/2022     '      Assessment & Plan:    RTC in next months for annual exam Nicki Reaper, NP

## 2022-11-25 NOTE — Assessment & Plan Note (Signed)
Continue Voltaren gel as needed Continue exercises as recommended by orthopedics

## 2022-11-25 NOTE — Assessment & Plan Note (Signed)
C-Met and lipid profile today Encouraged her to consume a low-fat diet

## 2022-11-25 NOTE — Assessment & Plan Note (Signed)
Continue Voltaren gel as needed. 

## 2022-11-25 NOTE — Patient Instructions (Signed)
Prediabetes Eating Plan Prediabetes is a condition that causes blood sugar (glucose) levels to be higher than normal. This increases the risk for developing type 2 diabetes (type 2 diabetes mellitus). Working with a health care provider or nutrition specialist (dietitian) to make diet and lifestyle changes can help prevent the onset of diabetes. These changes may help you: Control your blood glucose levels. Improve your cholesterol levels. Manage your blood pressure. What are tips for following this plan? Reading food labels Read food labels to check the amount of fat, salt (sodium), and sugar in prepackaged foods. Avoid foods that have: Saturated fats. Trans fats. Added sugars. Avoid foods that have more than 300 milligrams (mg) of sodium per serving. Limit your sodium intake to less than 2,300 mg each day. Shopping Avoid buying pre-made and processed foods. Avoid buying drinks with added sugar. Cooking Cook with olive oil. Do not use butter, lard, or ghee. Bake, broil, grill, steam, or boil foods. Avoid frying. Meal planning  Work with your dietitian to create an eating plan that is right for you. This may include tracking how many calories you take in each day. Use a food diary, notebook, or mobile application to track what you eat at each meal. Consider following a Mediterranean diet. This includes: Eating several servings of fresh fruits and vegetables each day. Eating fish at least twice a week. Eating one serving each day of whole grains, beans, nuts, and seeds. Using olive oil instead of other fats. Limiting alcohol. Limiting red meat. Using nonfat or low-fat dairy products. Consider following a plant-based diet. This includes dietary choices that focus on eating mostly vegetables and fruit, grains, beans, nuts, and seeds. If you have high blood pressure, you may need to limit your sodium intake or follow a diet such as the DASH (Dietary Approaches to Stop Hypertension) eating  plan. The DASH diet aims to lower high blood pressure. Lifestyle Set weight loss goals with help from your health care team. It is recommended that most people with prediabetes lose 7% of their body weight. Exercise for at least 30 minutes 5 or more days a week. Attend a support group or seek support from a mental health counselor. Take over-the-counter and prescription medicines only as told by your health care provider. What foods are recommended? Fruits Berries. Bananas. Apples. Oranges. Grapes. Papaya. Mango. Pomegranate. Kiwi. Grapefruit. Cherries. Vegetables Lettuce. Spinach. Peas. Beets. Cauliflower. Cabbage. Broccoli. Carrots. Tomatoes. Squash. Eggplant. Herbs. Peppers. Onions. Cucumbers. Brussels sprouts. Grains Whole grains, such as whole-wheat or whole-grain breads, crackers, cereals, and pasta. Unsweetened oatmeal. Bulgur. Barley. Quinoa. Brown rice. Corn or whole-wheat flour tortillas or taco shells. Meats and other proteins Seafood. Poultry without skin. Lean cuts of pork and beef. Tofu. Eggs. Nuts. Beans. Dairy Low-fat or fat-free dairy products, such as yogurt, cottage cheese, and cheese. Beverages Water. Tea. Coffee. Sugar-free or diet soda. Seltzer water. Low-fat or nonfat milk. Milk alternatives, such as soy or almond milk. Fats and oils Olive oil. Canola oil. Sunflower oil. Grapeseed oil. Avocado. Walnuts. Sweets and desserts Sugar-free or low-fat pudding. Sugar-free or low-fat ice cream and other frozen treats. Seasonings and condiments Herbs. Sodium-free spices. Mustard. Relish. Low-salt, low-sugar ketchup. Low-salt, low-sugar barbecue sauce. Low-fat or fat-free mayonnaise. The items listed above may not be a complete list of recommended foods and beverages. Contact a dietitian for more information. What foods are not recommended? Fruits Fruits canned with syrup. Vegetables Canned vegetables. Frozen vegetables with butter or cream sauce. Grains Refined white  flour and flour   products, such as bread, pasta, snack foods, and cereals. Meats and other proteins Fatty cuts of meat. Poultry with skin. Breaded or fried meat. Processed meats. Dairy Full-fat yogurt, cheese, or milk. Beverages Sweetened drinks, such as iced tea and soda. Fats and oils Butter. Lard. Ghee. Sweets and desserts Baked goods, such as cake, cupcakes, pastries, cookies, and cheesecake. Seasonings and condiments Spice mixes with added salt. Ketchup. Barbecue sauce. Mayonnaise. The items listed above may not be a complete list of foods and beverages that are not recommended. Contact a dietitian for more information. Where to find more information American Diabetes Association: www.diabetes.org Summary You may need to make diet and lifestyle changes to help prevent the onset of diabetes. These changes can help you control blood sugar, improve cholesterol levels, and manage blood pressure. Set weight loss goals with help from your health care team. It is recommended that most people with prediabetes lose 7% of their body weight. Consider following a Mediterranean diet. This includes eating plenty of fresh fruits and vegetables, whole grains, beans, nuts, seeds, fish, and low-fat dairy, and using olive oil instead of other fats. This information is not intended to replace advice given to you by your health care provider. Make sure you discuss any questions you have with your health care provider. Document Revised: 03/23/2019 Document Reviewed: 03/23/2019 Elsevier Patient Education  2024 [employer] Inc.  

## 2022-11-25 NOTE — Assessment & Plan Note (Signed)
Continue calcium and vitamin D daily Encourage daily weightbearing exercise

## 2022-11-25 NOTE — Assessment & Plan Note (Signed)
A1c today Encourage low-carb diet and exercise for weight loss

## 2022-11-25 NOTE — Assessment & Plan Note (Signed)
In remission.

## 2022-11-25 NOTE — Assessment & Plan Note (Signed)
Continue mometasone ointment as needed

## 2022-11-25 NOTE — Assessment & Plan Note (Signed)
Continue buspirone as needed Support offered

## 2022-11-25 NOTE — Assessment & Plan Note (Signed)
Controlled off meds Monitor

## 2022-11-26 LAB — COMPLETE METABOLIC PANEL WITH GFR
AG Ratio: 1.6 (calc) (ref 1.0–2.5)
ALT: 13 U/L (ref 6–29)
AST: 16 U/L (ref 10–35)
Albumin: 4.6 g/dL (ref 3.6–5.1)
Alkaline phosphatase (APISO): 65 U/L (ref 37–153)
BUN: 17 mg/dL (ref 7–25)
CO2: 29 mmol/L (ref 20–32)
Calcium: 10 mg/dL (ref 8.6–10.4)
Chloride: 104 mmol/L (ref 98–110)
Creat: 0.99 mg/dL (ref 0.60–1.00)
Globulin: 2.9 g/dL (ref 1.9–3.7)
Glucose, Bld: 89 mg/dL (ref 65–99)
Potassium: 4.9 mmol/L (ref 3.5–5.3)
Sodium: 141 mmol/L (ref 135–146)
Total Bilirubin: 0.9 mg/dL (ref 0.2–1.2)
Total Protein: 7.5 g/dL (ref 6.1–8.1)
eGFR: 60 mL/min/{1.73_m2} (ref >=60)

## 2022-11-26 LAB — LIPID PANEL
Cholesterol: 191 mg/dL (ref <200)
HDL: 70 mg/dL (ref >=50)
LDL Cholesterol (Calc): 99 mg/dL
Non-HDL Cholesterol (Calc): 121 mg/dL (ref <130)
Total CHOL/HDL Ratio: 2.7 (calc) (ref <5.0)
Triglycerides: 120 mg/dL (ref <150)

## 2022-11-26 LAB — CBC
HCT: 39.9 % (ref 35.0–45.0)
Hemoglobin: 13 g/dL (ref 11.7–15.5)
MCH: 28.8 pg (ref 27.0–33.0)
MCHC: 32.6 g/dL (ref 32.0–36.0)
MCV: 88.3 fL (ref 80.0–100.0)
MPV: 12.3 fL (ref 7.5–12.5)
Platelets: 239 10*3/uL (ref 140–400)
RBC: 4.52 10*6/uL (ref 3.80–5.10)
RDW: 12.2 % (ref 11.0–15.0)
WBC: 6 10*3/uL (ref 3.8–10.8)

## 2022-11-26 LAB — HEMOGLOBIN A1C
Hgb A1c MFr Bld: 5.7 %{Hb} — ABNORMAL HIGH (ref <5.7)
Mean Plasma Glucose: 117 mg/dL
eAG (mmol/L): 6.5 mmol/L

## 2022-11-28 ENCOUNTER — Other Ambulatory Visit: Payer: Self-pay | Admitting: Internal Medicine

## 2022-11-30 NOTE — Telephone Encounter (Signed)
Requested Prescriptions  Pending Prescriptions Disp Refills   albuterol (VENTOLIN HFA) 108 (90 Base) MCG/ACT inhaler [Pharmacy Med Name: ALBUTEROL SUL HFA 90 MCG INH] 6.7 g 3    Sig: INHALE TWO PUFFS BY MOUTH EVERY 6 HOURS AS NEEDED FOR WHEEZING     Pulmonology:  Beta Agonists 2 Passed - 11/28/2022  9:27 AM      Passed - Last BP in normal range    BP Readings from Last 1 Encounters:  11/25/22 110/68         Passed - Last Heart Rate in normal range    Pulse Readings from Last 1 Encounters:  11/25/22 75         Passed - Valid encounter within last 12 months    Recent Outpatient Visits           5 days ago Pre-diabetes   [place] [facility] [place], Salvadore Oxford, NP   6 months ago Encounter for general adult medical examination with abnormal findings   [place] [facility] [place], Salvadore Oxford, NP   1 year ago Encounter for screening mammogram for malignant neoplasm of breast   [place] [facility] [place], Salvadore Oxford, NP   1 year ago Encounter for general adult medical examination with abnormal findings   [facility] [place], Salvadore Oxford, NP   2 years ago Stage 3a chronic kidney disease [facility])   [place] [facility] [place], Salvadore Oxford, NP       Future Appointments             In 5 months Baity, Salvadore Oxford, NP [place] [facility], [facility]

## 2023-01-14 ENCOUNTER — Other Ambulatory Visit: Payer: Self-pay | Admitting: Internal Medicine

## 2023-01-14 DIAGNOSIS — Z1231 Encounter for screening mammogram for malignant neoplasm of breast: Secondary | ICD-10-CM

## 2023-01-28 ENCOUNTER — Ambulatory Visit
Admission: RE | Admit: 2023-01-28 | Discharge: 2023-01-28 | Disposition: A | Discharge status: Home / Self Care | Payer: Medicare HMO | Source: Ambulatory Visit | Attending: Internal Medicine | Admitting: Internal Medicine

## 2023-01-28 ENCOUNTER — Other Ambulatory Visit: Payer: Self-pay | Admitting: Internal Medicine

## 2023-01-28 DIAGNOSIS — Z1231 Encounter for screening mammogram for malignant neoplasm of breast: Secondary | ICD-10-CM

## 2023-02-03 ENCOUNTER — Other Ambulatory Visit: Payer: Self-pay | Admitting: Internal Medicine

## 2023-02-04 NOTE — Telephone Encounter (Signed)
Requested Prescriptions  Pending Prescriptions Disp Refills   busPIRone (BUSPAR) 5 MG tablet [Pharmacy Med Name: BUSPIRONE 5 MG TAB[*]] 90 tablet 0    Sig: TAKE ONE TABLET BY MOUTH ONE TIME DAILY AS NEEDED     Psychiatry: Anxiolytics/Hypnotics - Non-controlled Passed - 02/04/2023  2:52 PM      Passed - Valid encounter within last 12 months    Recent Outpatient Visits           2 months ago Pre-diabetes   [place] [facility] [place], Salvadore Oxford, NP   8 months ago Encounter for general adult medical examination with abnormal findings   [place] [facility] [place], Salvadore Oxford, NP   1 year ago Encounter for screening mammogram for malignant neoplasm of breast   [place] [facility] [place], Salvadore Oxford, NP   1 year ago Encounter for general adult medical examination with abnormal findings   [place] [facility] [place], Salvadore Oxford, NP   2 years ago Stage 3a chronic kidney disease [facility])   [place] [facility] [place], Salvadore Oxford, NP       Future Appointments             In 3 months Baity, Salvadore Oxford, NP [place] [facility], [facility]

## 2023-02-16 ENCOUNTER — Ambulatory Visit: Payer: Medicare HMO | Admitting: Internal Medicine

## 2023-02-16 ENCOUNTER — Encounter: Payer: Self-pay | Admitting: Internal Medicine

## 2023-02-16 VITALS — BP 118/78 | HR 96 | Temp 98.2°F | Ht 64.0 in | Wt 139.2 lb

## 2023-02-16 DIAGNOSIS — J029 Acute pharyngitis, unspecified: Secondary | ICD-10-CM

## 2023-02-16 DIAGNOSIS — J302 Other seasonal allergic rhinitis: Secondary | ICD-10-CM

## 2023-02-16 LAB — POC COVID19/FLU A&B COMBO
Covid Antigen, POC: NEGATIVE
Influenza A Antigen, POC: NEGATIVE
Influenza B Antigen, POC: NEGATIVE

## 2023-02-16 LAB — POCT RAPID STREP A (OFFICE): Rapid Strep A Screen: NEGATIVE

## 2023-02-16 NOTE — Patient Instructions (Signed)
Postnasal Drip Postnasal drip is the feeling of mucus going down the back of your throat. Mucus is a slimy substance that moistens and cleans your nose and throat, as well as the air pockets in face bones near your forehead and cheeks (sinuses). Small amounts of mucus pass from your nose and sinuses down the back of your throat all the time. This is normal. When you produce too much mucus or the mucus gets too thick, you can feel it. Some common causes of postnasal drip include: Having more mucus because of: A cold or the flu. Allergies. Cold air. Certain medicines. Gastroesophageal reflux. Having more mucus that is thicker because of: A sinus or nasal infection. Dry air. A food allergy. Follow these instructions at home: Relieving discomfort  Gargle with a mixture of salt and water 3-4 times a day or as needed. To make salt water, completely dissolve -1 tsp (3-6 g) of salt in 1 cup (237 mL) of warm water. If the air in your home is dry, use a humidifier to add moisture to the air. Use a saline spray or a container (neti pot) to flush out the nose (nasal irrigation). These methods can help clear away mucus and keep the nasal passages moist. General instructions Take over-the-counter and prescription medicines only as told by your health care provider. Follow instructions from your health care provider about eating or drinking restrictions. You may need to avoid caffeine. Avoid things that you know you are allergic to (allergens), like dust, mold, pollen, pets, or certain foods. Drink enough fluid to keep your urine pale yellow. Keep all follow-up visits. This is important. Contact a health care provider if: You have a fever. You have a sore throat or difficulty swallowing. You have a headache. You have sinus or ear pain. You have a cough that does not go away. The mucus from your nose becomes thick and is green or yellow in color. You have cold or flu symptoms that last more than 10  days. Summary Postnasal drip is the feeling of mucus going down the back of your throat. Use nasal irrigation or a nasal spray to help clear away mucus and keep the nasal passages moist. Avoid things that you know you are allergic to (allergens), like dust, mold, pollen, pets, or certain foods. This information is not intended to replace advice given to you by your health care provider. Make sure you discuss any questions you have with your health care provider. Document Revised: 11/21/2020 Document Reviewed: 11/21/2020 Elsevier Patient Education  2024 [employer].

## 2023-02-16 NOTE — Progress Notes (Signed)
Subjective:    Patient ID: Audrey Higgins, female    DOB: 1950/01/26, 73 y.o.   MRN: [id number]  HPI      Discussed the use of AI scribe software for clinical note transcription with the patient, who gave verbal consent to proceed.   Audrey Higgins "[name]" is a 73 year old female who presents with right-sided gland tenderness and postnasal drip.  She has been experiencing tenderness in the glands on the right Higgins for a little over a week, accompanied by postnasal drip on the same Higgins. No runny nose or nasal congestion is present. She took a sinus pill yesterday, which provided some relief. Her husband suspects it might be a sinus infection.  She experiences a slight headache at night, which coincides with the gland tenderness and postnasal drip. She has a history of asthma, and notes that coughing, which is not abnormal for her, causes soreness in the gland area. No nausea, vomiting, diarrhea, fever, chills, or body aches are present. No one around her has been sick.  Two weeks ago, she visited the dentist, where a possible papilloma on her tongue was noted, but she was advised not to worry about it.  She experiences stress, which she attributes partly to playing a video game, and wonders if this might be contributing to her symptoms. She has allergies year-round and describes her current symptoms as a cross between allergy and stress-related discomfort.        Review of Systems   Past Medical History:  Diagnosis Date   Allergy    Arthritis    Asthma    Breast cancer (HCC) 1990   RT MASTECTOMY   Cancer (HCC) 2015   Uterine   Chicken pox    Chronic kidney disease    History of uterine cancer 08/29/2019   Hypertension    Osteopenia    Personal history of chemotherapy 1990   BREAST CA   Personal history of radiation therapy 2014   UTERINE CA   Plantar fasciitis of right foot 10/20/2022    Current Outpatient Medications  Medication Sig Dispense Refill    albuterol (VENTOLIN HFA) 108 (90 Base) MCG/ACT inhaler INHALE TWO PUFFS BY MOUTH EVERY 6 HOURS AS NEEDED FOR WHEEZING 6.7 g 3   busPIRone (BUSPAR) 5 MG tablet TAKE ONE TABLET BY MOUTH ONE TIME DAILY AS NEEDED 90 tablet 0   loratadine (CLARITIN) 10 MG tablet Take 1 tablet (10 mg total) by mouth daily. 90 tablet 0   mometasone (ELOCON) 0.1 % cream Apply 1 Application topically daily. 45 g 0   No current facility-administered medications for this visit.    No Known Allergies  Family History  Problem Relation Age of Onset   Diabetes Mother    Breast cancer Mother 29   Arthritis Mother    Cancer Mother    Hypertension Father    Hodgkin's lymphoma Father    Colon cancer Brother    Healthy Brother    Diabetes Son    Hypertension Son     Social History   Socioeconomic History   Marital status: Married    Spouse name: Not on file   Number of children: Not on file   Years of education: Not on file   Highest education level: Associate degree: occupational, Scientist, product/process development, or vocational program  Occupational History   Not on file  Tobacco Use   Smoking status: Never   Smokeless tobacco: Never  Vaping Use   Vaping status: Never Used  Substance and Sexual Activity   Alcohol use: Yes    Alcohol/week: 1.0 standard drink of alcohol    Types: 1 Glasses of wine per week    Comment: 1-2 month   Drug use: Never   Sexual activity: Yes  Other Topics Concern   Not on file  Social History Narrative   Not on file   Social Drivers of Health   Financial Resource Strain: Low Risk  (11/23/2022)   Overall Financial Resource Strain (CARDIA)    Difficulty of Paying Living Expenses: Not hard at all  Food Insecurity: No Food Insecurity (11/23/2022)   Hunger Vital Sign    Worried About Running Out of Food in the Last Year: Never true    Ran Out of Food in the Last Year: Never true  Transportation Needs: No Transportation Needs (11/23/2022)   PRAPARE - Administrator, Civil Service  (Medical): No    Lack of Transportation (Non-Medical): No  Physical Activity: Insufficiently Active (11/23/2022)   Exercise Vital Sign    Days of Exercise per Week: 4 days    Minutes of Exercise per Session: 20 min  Stress: No Stress Concern Present (11/23/2022)   [employer] of Occupational Health - Occupational Stress Questionnaire    Feeling of Stress : Not at all  Social Connections: Unknown (11/23/2022)   Social Connection and Isolation Panel [NHANES]    Frequency of Communication with Friends and Family: Once a week    Frequency of Social Gatherings with Friends and Family: Once a week    Attends Religious Services: Patient declined    Database administrator or Organizations: No    Attends Engineer, structural: Not on file    Marital Status: Married  Intimate Partner Violence: Patient Declined (11/25/2022)   Humiliation, Afraid, Rape, and Kick questionnaire    Fear of Current or Ex-Partner: Patient declined    Emotionally Abused: Patient declined    Physically Abused: Patient declined    Sexually Abused: Patient declined     Constitutional: Pt reports headache. Denies fever, malaise, fatigue, or abrupt weight changes.  HEENT: Pt reports post nasal drip, sore throat. Denies eye pain, eye redness, ear pain, ringing in the ears, wax buildup, runny nose, nasal congestion, bloody nose. Respiratory: Denies difficulty breathing, shortness of breath, cough or sputum production.   Cardiovascular: Denies chest pain, chest tightness, palpitations or swelling in the hands or feet.  Gastrointestinal: Denies abdominal pain, bloating, constipation, diarrhea or blood in the stool.  Skin: Denies redness, rashes, lesions or ulcercations.  Neurological: Denies dizziness, difficulty with memory, difficulty with speech or problems with balance and coordination.   No other specific complaints in a complete review of systems (except as listed in HPI above).      Objective:    Physical Exam  BP 118/78 (BP Location: Right Arm, Patient Position: Sitting, Cuff Size: Normal)   Pulse 96   Temp 98.2 F (36.8 C)   Ht 5\' 4"  (1.626 m)   Wt 139 lb 3.2 oz (63.1 kg)   SpO2 99%   BMI 23.89 kg/m   Wt Readings from Last 3 Encounters:  11/25/22 142 lb 6.4 oz (64.6 kg)  05/22/22 140 lb (63.5 kg)  10/20/21 148 lb (67.1 kg)    General: Appears her stated age, well developed, well nourished in NAD. HEENT: Head: normal shape and size; Eyes: sclera white, no icterus, conjunctiva pink, PERRLA and EOMs intact; Ears: Tm's gray and intact, normal light reflex; Nose: mucosa  pink and moist, septum midline; Throat/Mouth: Teeth present, mucosa pink and moist, + PND, no exudate, lesions or ulcerations noted.  Neck: No adenopathy the noted. Cardiovascular: Normal rate and rhythm.  Pulmonary/Chest: Normal effort and positive vesicular breath sounds. No respiratory distress. No wheezes, rales or ronchi noted.  Neurological: Alert and oriented.   BMET    Component Value Date/Time   NA 141 11/25/2022 0922   K 4.9 11/25/2022 0922   CL 104 11/25/2022 0922   CO2 29 11/25/2022 0922   GLUCOSE 89 11/25/2022 0922   BUN 17 11/25/2022 0922   CREATININE 0.99 11/25/2022 0922   CALCIUM 10.0 11/25/2022 0922    Lipid Panel     Component Value Date/Time   CHOL 191 11/25/2022 0922   TRIG 120 11/25/2022 0922   HDL 70 11/25/2022 0922   CHOLHDL 2.7 11/25/2022 0922   VLDL 23.4 08/30/2019 0849   LDLCALC 99 11/25/2022 0922    CBC    Component Value Date/Time   WBC 6.0 11/25/2022 0922   RBC 4.52 11/25/2022 0922   HGB 13.0 11/25/2022 0922   HGB 13.8 11/28/2012 0829   HCT 39.9 11/25/2022 0922   HCT 32.8 (L) 12/06/2012 0542   PLT 239 11/25/2022 0922   MCV 88.3 11/25/2022 0922   MCH 28.8 11/25/2022 0922   MCHC 32.6 11/25/2022 0922   RDW 12.2 11/25/2022 0922   LYMPHSABS 2.0 08/30/2019 0849   MONOABS 0.8 08/30/2019 0849   EOSABS 0.1 08/30/2019 0849   BASOSABS 0.1 08/30/2019 0849     Hgb A1C Lab Results  Component Value Date   HGBA1C 5.7 (H) 11/25/2022            Assessment & Plan:   Assessment and Plan    Post nasal drip, headache, seasonal allergies Tender right cervical lymph node with associated headache and postnasal drip for over a week. Negative for strep, flu, and COVID. Possible stress and/or allergy related. -Consider Zyrtec and Flonase for potential allergy symptoms. -Encouraged neck stretching and avoiding prolonged strain. -Return if symptoms worsen or do not improve.    RTC in 3 months for your annual exam Nicki Reaper, NP

## 2023-05-25 ENCOUNTER — Ambulatory Visit (INDEPENDENT_AMBULATORY_CARE_PROVIDER_SITE_OTHER): Payer: Self-pay | Admitting: Internal Medicine

## 2023-05-25 ENCOUNTER — Encounter: Payer: Self-pay | Admitting: Internal Medicine

## 2023-05-25 VITALS — BP 130/82 | Ht 64.0 in | Wt 137.0 lb

## 2023-05-25 DIAGNOSIS — E7800 Pure hypercholesterolemia, unspecified: Secondary | ICD-10-CM | POA: Diagnosis not present

## 2023-05-25 DIAGNOSIS — Z0001 Encounter for general adult medical examination with abnormal findings: Secondary | ICD-10-CM

## 2023-05-25 DIAGNOSIS — Z1231 Encounter for screening mammogram for malignant neoplasm of breast: Secondary | ICD-10-CM

## 2023-05-25 DIAGNOSIS — R7303 Prediabetes: Secondary | ICD-10-CM

## 2023-05-25 DIAGNOSIS — Z780 Asymptomatic menopausal state: Secondary | ICD-10-CM | POA: Diagnosis not present

## 2023-05-25 NOTE — Progress Notes (Signed)
 Subjective:    Patient ID: Audrey Higgins, female    DOB: 1950-04-23, 73 y.o.   MRN: [id number]  HPI  Patient presents to clinic today for her annual exam.    Flu: 09/2022 Tetanus: 06/2016 COVID: Pfizer x 2 Pneumovax: 10/2013 Prevnar: 05/2021 Shingrix: 03/2017, 09/2017 Pap smear: Hysterectomy Mammogram: 01/2023 Bone density: 09/2019 Colon screening: > 10 years ago Vision screening: annually Dentist: biannually  Diet: She does eat mostly lean meat. She consumes some fruits and veggies. She does eat some fried foods. She drinks mostly water and coffee. Exercise: Walking  Review of Systems     Past Medical History:  Diagnosis Date   Allergy    Arthritis    Asthma    Breast cancer (HCC) 1990   RT MASTECTOMY   Cancer (HCC) 2015   Uterine   Chicken pox    Chronic kidney disease    History of uterine cancer 08/29/2019   Hypertension    Osteopenia    Personal history of chemotherapy 1990   BREAST CA   Personal history of radiation therapy 2014   UTERINE CA   Plantar fasciitis of right foot 10/20/2022    Current Outpatient Medications  Medication Sig Dispense Refill   albuterol  (VENTOLIN  HFA) 108 (90 Base) MCG/ACT inhaler INHALE TWO PUFFS BY MOUTH EVERY 6 HOURS AS NEEDED FOR WHEEZING 6.7 g 3   busPIRone  (BUSPAR ) 5 MG tablet TAKE ONE TABLET BY MOUTH ONE TIME DAILY AS NEEDED 90 tablet 0   loratadine  (CLARITIN ) 10 MG tablet Take 1 tablet (10 mg total) by mouth daily. (Patient not taking: Reported on 02/16/2023) 90 tablet 0   mometasone  (ELOCON ) 0.1 % cream Apply 1 Application topically daily. 45 g 0   No current facility-administered medications for this visit.    No Known Allergies  Family History  Problem Relation Age of Onset   Diabetes Mother    Breast cancer Mother 25   Arthritis Mother    Cancer Mother    Hypertension Father    Hodgkin's lymphoma Father    Colon cancer Brother    Healthy Brother    Diabetes Son    Hypertension Son     Social History    Socioeconomic History   Marital status: Married    Spouse name: Not on file   Number of children: Not on file   Years of education: Not on file   Highest education level: Associate degree: academic program  Occupational History   Not on file  Tobacco Use   Smoking status: Never   Smokeless tobacco: Never  Vaping Use   Vaping status: Never Used  Substance and Sexual Activity   Alcohol use: Yes    Alcohol/week: 1.0 standard drink of alcohol    Types: 1 Glasses of wine per week    Comment: 1-2 month   Drug use: Never   Sexual activity: Yes  Other Topics Concern   Not on file  Social History Narrative   Not on file   Social Drivers of Health   Financial Resource Strain: Low Risk  (05/24/2023)   Overall Financial Resource Strain (CARDIA)    Difficulty of Paying Living Expenses: Not hard at all  Food Insecurity: No Food Insecurity (05/24/2023)   Hunger Vital Sign    Worried About Running Out of Food in the Last Year: Never true    Ran Out of Food in the Last Year: Never true  Transportation Needs: No Transportation Needs (05/24/2023)   PRAPARE - Transportation  Lack of Transportation (Medical): No    Lack of Transportation (Non-Medical): No  Physical Activity: Insufficiently Active (05/24/2023)   Exercise Vital Sign    Days of Exercise per Week: 7 days    Minutes of Exercise per Session: 10 min  Stress: No Stress Concern Present (05/24/2023)   [employer] of Occupational Health - Occupational Stress Questionnaire    Feeling of Stress : Not at all  Social Connections: Socially Isolated (05/24/2023)   Social Connection and Isolation Panel [NHANES]    Frequency of Communication with Friends and Family: Once a week    Frequency of Social Gatherings with Friends and Family: Once a week    Attends Religious Services: Never    Database administrator or Organizations: No    Attends Engineer, structural: Not on file    Marital Status: Married  Intimate Partner  Violence: Patient Declined (11/25/2022)   Humiliation, Afraid, Rape, and Kick questionnaire    Fear of Current or Ex-Partner: Patient declined    Emotionally Abused: Patient declined    Physically Abused: Patient declined    Sexually Abused: Patient declined     Constitutional: Denies fever, malaise, fatigue, headache or abrupt weight changes.  HEENT: Denies eye pain, eye redness, ear pain, ringing in the ears, wax buildup, runny nose, nasal congestion, bloody nose, or sore throat. Respiratory: Denies difficulty breathing, shortness of breath, cough or sputum production.   Cardiovascular: Denies chest pain, chest tightness, palpitations or swelling in the hands or feet.  Gastrointestinal: Denies abdominal pain, bloating, constipation, diarrhea or blood in the stool.  GU: Denies urgency, frequency, pain with urination, burning sensation, blood in urine, odor or discharge. Musculoskeletal: Patient reports joint pain.  Denies decrease in range of motion, difficulty with gait, muscle pain or joint swelling.  Skin: Denies redness, rashes, lesions or ulcercations.  Neurological: Denies dizziness, difficulty with memory, difficulty with speech or problems with balance and coordination.  Psych: Patient has a history of anxiety.  Denies depression, SI/HI.  No other specific complaints in a complete review of systems (except as listed in HPI above).  Objective:   Physical Exam  BP 130/82 (BP Location: Right Arm, Patient Position: Sitting, Cuff Size: Normal)   Ht 5\' 4"  (1.626 m)   Wt 137 lb (62.1 kg)   BMI 23.52 kg/m    Wt Readings from Last 3 Encounters:  02/16/23 139 lb 3.2 oz (63.1 kg)  11/25/22 142 lb 6.4 oz (64.6 kg)  05/22/22 140 lb (63.5 kg)    General: Appears her stated age, well developed, well nourished in NAD. Skin: Warm, dry and intact.  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 HEENT: Head: normal shape and size; Eyes: sclera white, no icterus, conjunctiva pink,  PERRLA and EOMs intact;  Neck:  Neck supple, trachea midline. No masses, lumps or thyromegaly present.  Cardiovascular: Normal rate and rhythm. S1,S2 noted.  No murmur, rubs or gallops noted. No JVD or BLE edema. No carotid bruits noted. Pulmonary/Chest: Normal effort and positive vesicular breath sounds. No respiratory distress. No wheezes, rales or ronchi noted.  Abdomen:  Normal bowel sounds.  Musculoskeletal: Joint enlargement noted of bilateral hands.  Strength 5/5 BUE/BLE.  No difficulty with gait.  Neurological: Alert and oriented. Cranial nerves II-XII grossly intact. Coordination normal.  Psychiatric: Mood and affect normal. Behavior is normal. Judgment and thought content normal.     BMET    Component Value Date/Time   NA 141 11/25/2022 0922   K 4.9  11/25/2022 0922   CL 104 11/25/2022 0922   CO2 29 11/25/2022 0922   GLUCOSE 89 11/25/2022 0922   BUN 17 11/25/2022 0922   CREATININE 0.99 11/25/2022 0922   CALCIUM 10.0 11/25/2022 0922    Lipid Panel     Component Value Date/Time   CHOL 191 11/25/2022 0922   TRIG 120 11/25/2022 0922   HDL 70 11/25/2022 0922   CHOLHDL 2.7 11/25/2022 0922   VLDL 23.4 08/30/2019 0849   LDLCALC 99 11/25/2022 0922    CBC    Component Value Date/Time   WBC 6.0 11/25/2022 0922   RBC 4.52 11/25/2022 0922   HGB 13.0 11/25/2022 0922   HGB 13.8 11/28/2012 0829   HCT 39.9 11/25/2022 0922   HCT 32.8 (L) 12/06/2012 0542   PLT 239 11/25/2022 0922   MCV 88.3 11/25/2022 0922   MCH 28.8 11/25/2022 0922   MCHC 32.6 11/25/2022 0922   RDW 12.2 11/25/2022 0922   LYMPHSABS 2.0 08/30/2019 0849   MONOABS 0.8 08/30/2019 0849   EOSABS 0.1 08/30/2019 0849   BASOSABS 0.1 08/30/2019 0849    Hgb A1C Lab Results  Component Value Date   HGBA1C 5.7 (H) 11/25/2022           Assessment & Plan:   Preventative Health Maintenance:  Encouraged her to get a flu shot in the fall Tetanus UTD Encouraged her to get her COVID booster Pneumovax and  Prevnar UTD Shingrix UTD She no longer needs to screen for cervical cancer Mammogram UTD Bone density ordered-she will call to schedule Colon screening declined Encouraged her to consume a balanced diet and exercise regimen Advised her to see an eye doctor and dentist annually Will check CBC, c-Met, lipid, A1c today  RTC in 6 months, follow-up chronic conditions Helayne Lo, NP

## 2023-05-25 NOTE — Patient Instructions (Signed)
 Health Maintenance for Postmenopausal Women Menopause is a normal process in which your ability to get pregnant comes to an end. This process happens slowly over many months or years, usually between the ages of 24 and 62. Menopause is complete when you have missed your menstrual period for 12 months. It is important to talk with your health care provider about some of the most common conditions that affect women after menopause (postmenopausal women). These include heart disease, cancer, and bone loss (osteoporosis). Adopting a healthy lifestyle and getting preventive care can help to promote your health and wellness. The actions you take can also lower your chances of developing some of these common conditions. What are the signs and symptoms of menopause? During menopause, you may have the following symptoms: Hot flashes. These can be moderate or severe. Night sweats. Decrease in sex drive. Mood swings. Headaches. Tiredness (fatigue). Irritability. Memory problems. Problems falling asleep or staying asleep. Talk with your health care provider about treatment options for your symptoms. Do I need hormone replacement therapy? Hormone replacement therapy is effective in treating symptoms that are caused by menopause, such as hot flashes and night sweats. Hormone replacement carries certain risks, especially as you become older. If you are thinking about using estrogen or estrogen with progestin, discuss the benefits and risks with your health care provider. How can I reduce my risk for heart disease and stroke? The risk of heart disease, heart attack, and stroke increases as you age. One of the causes may be a change in the body's hormones during menopause. This can affect how your body uses dietary fats, triglycerides, and cholesterol. Heart attack and stroke are medical emergencies. There are many things that you can do to help prevent heart disease and stroke. Watch your blood pressure High  blood pressure causes heart disease and increases the risk of stroke. This is more likely to develop in people who have high blood pressure readings or are overweight. Have your blood pressure checked: Every 3-5 years if you are 50-75 years of age. Every year if you are 77 years old or older. Eat a healthy diet  Eat a diet that includes plenty of vegetables, fruits, low-fat dairy products, and lean protein. Do not eat a lot of foods that are high in solid fats, added sugars, or sodium. Get regular exercise Get regular exercise. This is one of the most important things you can do for your health. Most adults should: Try to exercise for at least 150 minutes each week. The exercise should increase your heart rate and make you sweat (moderate-intensity exercise). Try to do strengthening exercises at least twice each week. Do these in addition to the moderate-intensity exercise. Spend less time sitting. Even light physical activity can be beneficial. Other tips Work with your health care provider to achieve or maintain a healthy weight. Do not use any products that contain nicotine or tobacco. These products include cigarettes, chewing tobacco, and vaping devices, such as e-cigarettes. If you need help quitting, ask your health care provider. Know your numbers. Ask your health care provider to check your cholesterol and your blood sugar (glucose). Continue to have your blood tested as directed by your health care provider. Do I need screening for cancer? Depending on your health history and family history, you may need to have cancer screenings at different stages of your life. This may include screening for: Breast cancer. Cervical cancer. Lung cancer. Colorectal cancer. What is my risk for osteoporosis? After menopause, you may be  at increased risk for osteoporosis. Osteoporosis is a condition in which bone destruction happens more quickly than new bone creation. To help prevent osteoporosis or  the bone fractures that can happen because of osteoporosis, you may take the following actions: If you are 61-3 years old, get at least 1,000 mg of calcium and at least 600 international units (IU) of vitamin D per day. If you are older than age 61 but younger than age 75, get at least 1,200 mg of calcium and at least 600 international units (IU) of vitamin D per day. If you are older than age 62, get at least 1,200 mg of calcium and at least 800 international units (IU) of vitamin D per day. Smoking and drinking excessive alcohol increase the risk of osteoporosis. Eat foods that are rich in calcium and vitamin D, and do weight-bearing exercises several times each week as directed by your health care provider. How does menopause affect my mental health? Depression may occur at any age, but it is more common as you become older. Common symptoms of depression include: Feeling depressed. Changes in sleep patterns. Changes in appetite or eating patterns. Feeling an overall lack of motivation or enjoyment of activities that you previously enjoyed. Frequent crying spells. Talk with your health care provider if you think that you are experiencing any of these symptoms. General instructions See your health care provider for regular wellness exams and vaccines. This may include: Scheduling regular health, dental, and eye exams. Getting and maintaining your vaccines. These include: Influenza vaccine. Get this vaccine each year before the flu season begins. Pneumonia vaccine. Shingles vaccine. Tetanus, diphtheria, and pertussis (Tdap) booster vaccine. Your health care provider may also recommend other immunizations. Tell your health care provider if you have ever been abused or do not feel safe at home. Summary Menopause is a normal process in which your ability to get pregnant comes to an end. This condition causes hot flashes, night sweats, decreased interest in sex, mood swings, headaches, or lack  of sleep. Treatment for this condition may include hormone replacement therapy. Take actions to keep yourself healthy, including exercising regularly, eating a healthy diet, watching your weight, and checking your blood pressure and blood sugar levels. Get screened for cancer and depression. Make sure that you are up to date with all your vaccines. This information is not intended to replace advice given to you by your health care provider. Make sure you discuss any questions you have with your health care provider. Document Revised: 05/13/2020 Document Reviewed: 05/13/2020 Elsevier Patient Education  2024 [employer].

## 2023-05-26 ENCOUNTER — Ambulatory Visit: Payer: Self-pay | Admitting: Internal Medicine

## 2023-05-26 LAB — LIPID PANEL
Cholesterol: 190 mg/dL (ref <200)
HDL: 78 mg/dL (ref >=50)
LDL Cholesterol (Calc): 93 mg/dL
Non-HDL Cholesterol (Calc): 112 mg/dL (ref <130)
Total CHOL/HDL Ratio: 2.4 (calc) (ref <5.0)
Triglycerides: 94 mg/dL (ref <150)

## 2023-05-26 LAB — COMPREHENSIVE METABOLIC PANEL WITH GFR
AG Ratio: 1.5 (calc) (ref 1.0–2.5)
ALT: 13 U/L (ref 6–29)
AST: 16 U/L (ref 10–35)
Albumin: 4.6 g/dL (ref 3.6–5.1)
Alkaline phosphatase (APISO): 61 U/L (ref 37–153)
BUN/Creatinine Ratio: 14 (calc) (ref 6–22)
BUN: 15 mg/dL (ref 7–25)
CO2: 28 mmol/L (ref 20–32)
Calcium: 10.2 mg/dL (ref 8.6–10.4)
Chloride: 103 mmol/L (ref 98–110)
Creat: 1.05 mg/dL — ABNORMAL HIGH (ref 0.60–1.00)
Globulin: 3 g/dL (ref 1.9–3.7)
Glucose, Bld: 87 mg/dL (ref 65–99)
Potassium: 4.6 mmol/L (ref 3.5–5.3)
Sodium: 141 mmol/L (ref 135–146)
Total Bilirubin: 1.3 mg/dL — ABNORMAL HIGH (ref 0.2–1.2)
Total Protein: 7.6 g/dL (ref 6.1–8.1)
eGFR: 56 mL/min/{1.73_m2} — ABNORMAL LOW (ref >=60)

## 2023-05-26 LAB — CBC
HCT: 41.4 % (ref 35.0–45.0)
Hemoglobin: 13.4 g/dL (ref 11.7–15.5)
MCH: 29.3 pg (ref 27.0–33.0)
MCHC: 32.4 g/dL (ref 32.0–36.0)
MCV: 90.6 fL (ref 80.0–100.0)
MPV: 12 fL (ref 7.5–12.5)
Platelets: 239 10*3/uL (ref 140–400)
RBC: 4.57 10*6/uL (ref 3.80–5.10)
RDW: 13 % (ref 11.0–15.0)
WBC: 5.8 10*3/uL (ref 3.8–10.8)

## 2023-05-26 LAB — HEMOGLOBIN A1C
Hgb A1c MFr Bld: 5.6 % (ref <5.7)
Mean Plasma Glucose: 114 mg/dL
eAG (mmol/L): 6.3 mmol/L

## 2023-06-26 ENCOUNTER — Other Ambulatory Visit: Payer: Self-pay | Admitting: Internal Medicine

## 2023-06-29 NOTE — Telephone Encounter (Signed)
 Requested medication (s) are due for refill today: yes  Requested medication (s) are on the active medication list: historical med  Last refill:  05/25/23 historical med  Future visit scheduled: yes  Notes to clinic:  historical provider   Requested Prescriptions  Pending Prescriptions Disp Refills   ARNUITY ELLIPTA  100 MCG/ACT AEPB [Pharmacy Med Name: ARNUITY ELLIPTA  100 MCG INH] 90 each 1    Sig: INHALE ONE PUFF BY MOUTH ONE TIME DAILY     Pulmonology:  Corticosteroids Failed - 06/29/2023 11:32 AM      Failed - Valid encounter within last 12 months    Recent Outpatient Visits           1 month ago Encounter for general adult medical examination with abnormal findings   [place] [facility], Angeline ORN, NP   4 months ago Seasonal allergic rhinitis, unspecified trigger   [place] [facility] [place], Angeline ORN, [place]

## 2023-09-13 ENCOUNTER — Telehealth: Payer: Self-pay

## 2023-09-13 MED ORDER — COVID-19 MRNA VAC-TRIS(PFIZER) 30 MCG/0.3ML IM SUSY: 0.3000 mL | PREFILLED_SYRINGE | Freq: Once | INTRAMUSCULAR | 0 refills | Status: AC

## 2023-09-13 NOTE — Addendum Note (Signed)
 Addended by: ANTONETTE ANGELINE ORN on: 09/13/2023 01:16 PM   Modules accepted: Orders

## 2023-09-13 NOTE — Telephone Encounter (Signed)
 Communication  Reason for CRM: Pt is at [facility] fax [phone]. They are there to get there covid shot and need an rx sent over to be able to get that. Please fax to the number above.

## 2023-09-13 NOTE — Telephone Encounter (Signed)
 RX for covid vaccine sent to requested pharmacy

## 2023-09-23 ENCOUNTER — Encounter: Payer: Self-pay | Admitting: Internal Medicine

## 2023-09-23 ENCOUNTER — Ambulatory Visit (INDEPENDENT_AMBULATORY_CARE_PROVIDER_SITE_OTHER): Admitting: Internal Medicine

## 2023-09-23 VITALS — BP 124/78 | Ht 64.0 in | Wt 142.8 lb

## 2023-09-23 DIAGNOSIS — M67449 Ganglion, unspecified hand: Secondary | ICD-10-CM | POA: Diagnosis not present

## 2023-09-23 NOTE — Progress Notes (Signed)
 Subjective:    Patient ID: Audrey Higgins, female    DOB: 1950/02/26, 73 y.o.   MRN: [id number]  HPI  Discussed the use of AI scribe software for clinical note transcription with the patient, who gave verbal consent to proceed.  Audrey Higgins is a 73 year old female who presents with a nail bed lesion on her hand.  The lesion on her nail bed was first noticed approximately two months ago. Initially, it was larger, about twice its current size, and has since reduced. It has caused as a 'divot' in her nail.   She has not attempted to poke or drain the lesion due to fear of causing harm. She describes the lesion as perfectly round and similar to images of mucosal cysts she has seen online.       Review of Systems     Past Medical History:  Diagnosis Date   Allergy    Arthritis    Asthma    Breast cancer (HCC) 1990   RT MASTECTOMY   Cancer (HCC) 2015   Uterine   Chicken pox    Chronic kidney disease    History of uterine cancer 08/29/2019   Hypertension    Osteopenia    Personal history of chemotherapy 1990   BREAST CA   Personal history of radiation therapy 2014   UTERINE CA   Plantar fasciitis of right foot 10/20/2022    Current Outpatient Medications  Medication Sig Dispense Refill   albuterol  (VENTOLIN  HFA) 108 (90 Base) MCG/ACT inhaler INHALE TWO PUFFS BY MOUTH EVERY 6 HOURS AS NEEDED FOR WHEEZING 6.7 g 3   ARNUITY ELLIPTA  100 MCG/ACT AEPB INHALE ONE PUFF BY MOUTH ONE TIME DAILY 90 each 1   busPIRone  (BUSPAR ) 5 MG tablet TAKE ONE TABLET BY MOUTH ONE TIME DAILY AS NEEDED 90 tablet 0   loratadine  (CLARITIN ) 10 MG tablet Take 1 tablet (10 mg total) by mouth daily. 90 tablet 0   mometasone  (ELOCON ) 0.1 % cream Apply 1 Application topically daily. 45 g 0   No current facility-administered medications for this visit.    No Known Allergies  Family History  Problem Relation Age of Onset   Diabetes Mother    Breast cancer Mother 5   Arthritis Mother     Cancer Mother    Hypertension Father    Hodgkin's lymphoma Father    Colon cancer Brother    Healthy Brother    Diabetes Son    Hypertension Son     Social History   Socioeconomic History   Marital status: Married    Spouse name: Not on file   Number of children: Not on file   Years of education: Not on file   Highest education level: Associate degree: academic program  Occupational History   Not on file  Tobacco Use   Smoking status: Never   Smokeless tobacco: Never  Vaping Use   Vaping status: Never Used  Substance and Sexual Activity   Alcohol use: Yes    Alcohol/week: 1.0 standard drink of alcohol    Types: 1 Glasses of wine per week    Comment: 1-2 month   Drug use: Never   Sexual activity: Yes  Other Topics Concern   Not on file  Social History Narrative   Not on file   Social Drivers of Health   Financial Resource Strain: Low Risk  (09/20/2023)   Overall Financial Resource Strain (CARDIA)    Difficulty of Paying Living Expenses:  Not hard at all  Food Insecurity: No Food Insecurity (09/20/2023)   Hunger Vital Sign    Worried About Running Out of Food in the Last Year: Never true    Ran Out of Food in the Last Year: Never true  Transportation Needs: No Transportation Needs (09/20/2023)   PRAPARE - Administrator, Civil Service (Medical): No    Lack of Transportation (Non-Medical): No  Physical Activity: Insufficiently Active (09/20/2023)   Exercise Vital Sign    Days of Exercise per Week: 4 days    Minutes of Exercise per Session: 20 min  Stress: No Stress Concern Present (09/20/2023)   [employer] of Occupational Health - Occupational Stress Questionnaire    Feeling of Stress: Only a little  Social Connections: Moderately Isolated (09/20/2023)   Social Connection and Isolation Panel    Frequency of Communication with Friends and Family: Three times a week    Frequency of Social Gatherings with Friends and Family: Once a week    Attends  Religious Services: Patient declined    Database administrator or Organizations: No    Attends Engineer, structural: Not on file    Marital Status: Married  Intimate Partner Violence: Patient Declined (11/25/2022)   Humiliation, Afraid, Rape, and Kick questionnaire    Fear of Current or Ex-Partner: Patient declined    Emotionally Abused: Patient declined    Physically Abused: Patient declined    Sexually Abused: Patient declined     Constitutional: Denies fever, malaise, fatigue, headache or abrupt weight changes.  Respiratory: Denies difficulty breathing, shortness of breath, cough or sputum production.   Cardiovascular: Denies chest pain, chest tightness, palpitations or swelling in the hands or feet. Musculoskeletal: Patient reports joint pain.  Denies decrease in range of motion, difficulty with gait, muscle pain or joint swelling.  Skin: Patient reports mass of finger.  Denies redness, rashes, or ulcercations.  Neurological: Denies dizziness, difficulty with memory, difficulty with speech or problems with balance and coordination.    No other specific complaints in a complete review of systems (except as listed in HPI above).  Objective:   Physical Exam  BP 124/78 (BP Location: Right Arm, Patient Position: Sitting, Cuff Size: Normal)   Ht 5' 4 (1.626 m)   Wt 142 lb 12.8 oz (64.8 kg)   BMI 24.51 kg/m     Wt Readings from Last 3 Encounters:  05/25/23 137 lb (62.1 kg)  02/16/23 139 lb 3.2 oz (63.1 kg)  11/25/22 142 lb 6.4 oz (64.6 kg)    General: Appears her stated age, well developed, well nourished in NAD. Skin: Warm, dry and intact.  Mucocele noted at the base of the right index finger with nail deformity.  Cardiovascular: Normal rate. Pulmonary/Chest: Normal effort. No respiratory distress.  Musculoskeletal: Joint enlargement noted of bilateral hands.  Strength 5/5 BUE/BLE.  No difficulty with gait.  Neurological: Alert and oriented. Cranial nerves II-XII  grossly intact. Coordination normal.  Psychiatric: Mood and affect normal. Behavior is normal. Judgment and thought content normal.     BMET    Component Value Date/Time   NA 141 05/25/2023 0913   K 4.6 05/25/2023 0913   CL 103 05/25/2023 0913   CO2 28 05/25/2023 0913   GLUCOSE 87 05/25/2023 0913   BUN 15 05/25/2023 0913   CREATININE 1.05 (H) 05/25/2023 0913   CALCIUM 10.2 05/25/2023 0913    Lipid Panel     Component Value Date/Time   CHOL 190 05/25/2023 0913  TRIG 94 05/25/2023 0913   HDL 78 05/25/2023 0913   CHOLHDL 2.4 05/25/2023 0913   VLDL 23.4 08/30/2019 0849   LDLCALC 93 05/25/2023 0913    CBC    Component Value Date/Time   WBC 5.8 05/25/2023 0913   RBC 4.57 05/25/2023 0913   HGB 13.4 05/25/2023 0913   HGB 13.8 11/28/2012 0829   HCT 41.4 05/25/2023 0913   HCT 32.8 (L) 12/06/2012 0542   PLT 239 05/25/2023 0913   MCV 90.6 05/25/2023 0913   MCH 29.3 05/25/2023 0913   MCHC 32.4 05/25/2023 0913   RDW 13.0 05/25/2023 0913   LYMPHSABS 2.0 08/30/2019 0849   MONOABS 0.8 08/30/2019 0849   EOSABS 0.1 08/30/2019 0849   BASOSABS 0.1 08/30/2019 0849    Hgb A1C Lab Results  Component Value Date   HGBA1C 5.6 05/25/2023           Assessment & Plan:   Assessment and Plan   Mucocele of nail bed, hand Benign mucous cyst, fluid-filled, round, possibly related to arthritis. Fluctuates in size, likely due to minor trauma. - Advised to contact office for hand surgeon referral if removal desired. - She prefers to monitor at this time     RTC in 2 months, follow-up chronic conditions Angeline Laura, NP

## 2023-09-23 NOTE — Patient Instructions (Signed)
 Excision of Skin Lesions Excision of a skin lesion is the removal of a section of skin by making small incisions in the skin. Through this process, the lesion is completely removed. This procedure is often done to treat or prevent cancer or infection. It may also be done to improve cosmetic appearance. You may have this procedure to remove: Cancerous (malignant) growths, such as basal cell carcinoma, squamous cell carcinoma, or melanoma. Noncancerous (benign) growths, such as a cyst or lipoma. Growths, such as moles or skin tags, which may be removed for cosmetic reasons. Various excision or surgical techniques may be used depending on your condition, the location of the lesion, and your overall health. Tell your health care provider about: Any allergies you have. All medicines you are taking, including vitamins, herbs, eye drops, creams, and over-the-counter medicines. Any problems you or family members have had with anesthetic medicines. Any bleeding problems you have. Any surgeries you have had. Any medical conditions you have. Whether you are pregnant or may be pregnant. What are the risks? Generally, this is a safe procedure. However, problems may occur, including: Bleeding. Infection. Scarring. Recurrence of the cyst, lipoma, or cancer. Allergic reaction to anesthetics, surgical materials, or ointments. Damage to nerves, blood vessels, muscles, or other structures. What happens before the procedure? Medicines Ask your health care provider about: Changing or stopping your regular medicines. This is especially important if you are taking diabetes medicines or blood thinners. Taking medicines such as aspirin  and ibuprofen. These medicines can thin your blood. Do not take these medicines unless your health care provider tells you to take them. Taking over-the-counter medicines, vitamins, herbs, and supplements. General instructions Do not use any products that contain nicotine or  tobacco. These products include cigarettes, chewing tobacco, and vaping devices, such as e-cigarettes. If you need help quitting, ask your health care provider. Follow instructions from your health care provider about eating or drinking restrictions. Ask your health care provider: How your surgery site will be marked. What steps will be taken to help prevent infection. These steps may include: Removing hair at the surgery site. Washing skin with a germ-killing soap. Taking antibiotic medicine. Ask your health care provider if you will need someone to take you home from the hospital or clinic after the procedure. What happens during the procedure?  You will be given a medicine to numb the area (local anesthetic). Your health care provider will remove the lesions using one of the following excision techniques. Complete surgical excision. This procedure may be done to treat a cancerous growth or a noncancerous cyst or lesion. A small scalpel or scissors will be used to gently cut around and under the lesion until it is completely removed. If bleeding occurs, it will be stopped with a device that delivers heat (electrocautery). The edges of the wound may be stitched (sutured) together. A bandage (dressing) will be applied. Samples will be sent to a lab for testing. Excision of a cyst. An incision will be made on the cyst. The entire cyst will be removed through the incision. The incision may be closed with sutures. Shave excision. This may be done to remove a mole or other small growths. A small blade or scalpel will be used to shave off the lesion. The wound is usually left to heal on its own without sutures. The sample may be sent to a lab for testing. Punch excision. This may be done to completely remove a mole or other small growths. A small tool that  is like a cookie cutter or a hole punch is used to cut a circle shape out of the skin. The outer edges of the skin will be sutured  together. The sample may be sent to a lab for testing. Mohs micrographic surgery. This is usually done to treat skin cancer. This type of excision is mostly used on the face and ears. This procedure is minimally invasive, and it ensures the best cosmetic outcome. A scalpel or a loop instrument will be used to remove layers of the lesion until all the abnormal or cancerous tissue has been removed. The wound may be sutured, depending on its size. The tissue will be checked under a microscope right away. The procedure may vary among health care providers and hospitals. At the end of any of these procedures, antibiotic ointment will be applied as needed. What happens after the procedure? Talk with your health care provider to discuss any test results, treatment options, and if necessary, the need for more tests. Keep all follow-up visits. This is important. Summary Excision of a skin lesion is the removal of a section of skin by making small incisions in the skin. This procedure is often done to treat or prevent skin cancer, remove benign growths, or it may be done to improve cosmetic appearance. Various excision or surgical techniques may be used depending on your condition, the location of the lesion, and your overall health. After the procedure, talk with your health care provider to discuss any test results, treatment options, and if necessary, the need for more tests. Keep all follow-up visits. This is important. This information is not intended to replace advice given to you by your health care provider. Make sure you discuss any questions you have with your health care provider. Document Revised: 07/22/2020 Document Reviewed: 07/23/2020 Elsevier Patient Education  2025 [employer].

## 2023-10-25 DIAGNOSIS — H2513 Age-related nuclear cataract, bilateral: Secondary | ICD-10-CM | POA: Diagnosis not present

## 2023-10-25 DIAGNOSIS — Z0100 Encounter for examination of eyes and vision without abnormal findings: Secondary | ICD-10-CM | POA: Diagnosis not present

## 2023-11-25 ENCOUNTER — Ambulatory Visit (INDEPENDENT_AMBULATORY_CARE_PROVIDER_SITE_OTHER): Admitting: Internal Medicine

## 2023-11-25 ENCOUNTER — Encounter: Payer: Self-pay | Admitting: Internal Medicine

## 2023-11-25 VITALS — BP 124/78 | Ht 64.0 in | Wt 145.4 lb

## 2023-11-25 DIAGNOSIS — Z8542 Personal history of malignant neoplasm of other parts of uterus: Secondary | ICD-10-CM | POA: Diagnosis not present

## 2023-11-25 DIAGNOSIS — L301 Dyshidrosis [pompholyx]: Secondary | ICD-10-CM

## 2023-11-25 DIAGNOSIS — M722 Plantar fascial fibromatosis: Secondary | ICD-10-CM

## 2023-11-25 DIAGNOSIS — M8589 Other specified disorders of bone density and structure, multiple sites: Secondary | ICD-10-CM | POA: Diagnosis not present

## 2023-11-25 DIAGNOSIS — Z853 Personal history of malignant neoplasm of breast: Secondary | ICD-10-CM

## 2023-11-25 DIAGNOSIS — M19241 Secondary osteoarthritis, right hand: Secondary | ICD-10-CM

## 2023-11-25 DIAGNOSIS — E7800 Pure hypercholesterolemia, unspecified: Secondary | ICD-10-CM

## 2023-11-25 DIAGNOSIS — N1831 Chronic kidney disease, stage 3a: Secondary | ICD-10-CM | POA: Diagnosis not present

## 2023-11-25 DIAGNOSIS — J4540 Moderate persistent asthma, uncomplicated: Secondary | ICD-10-CM

## 2023-11-25 DIAGNOSIS — I10 Essential (primary) hypertension: Secondary | ICD-10-CM

## 2023-11-25 DIAGNOSIS — R7303 Prediabetes: Secondary | ICD-10-CM

## 2023-11-25 DIAGNOSIS — M19242 Secondary osteoarthritis, left hand: Secondary | ICD-10-CM | POA: Diagnosis not present

## 2023-11-25 NOTE — Assessment & Plan Note (Signed)
 In remission.

## 2023-11-25 NOTE — Assessment & Plan Note (Signed)
 Continue calcium and vitamin D  OTC daily Encourage daily weightbearing exercise

## 2023-11-25 NOTE — Assessment & Plan Note (Signed)
Controlled off meds Monitor

## 2023-11-25 NOTE — Patient Instructions (Signed)
 Chronic Kidney Disease: Eating Plan Chronic kidney disease (CKD) is when your kidneys aren't working well. They can't remove waste, fluids, and other substances from your blood. When these substances build up, they can worsen kidney damage and affect your health. Eating certain foods can lead to a buildup of these substances. Changing your diet can help prevent more kidney damage. Diet changes may also delay dialysis or even keep you from needing it. What nutrients should I limit? Work with your health care team and an expert in healthy eating (dietitian) to make a meal plan that's right for you. Foods you can eat and foods you should limit or avoid will depend on: The stage of your kidney disease. Any other conditions you have. The items listed below are not a complete list. Talk with a dietitian to learn what's best for you. Potassium Potassium affects how well your heart beats. Too much potassium in your blood can cause an irregular heartbeat or even a heart attack. You may need to limit foods that are high in potassium, such as: Liquid milk and soy milk. Salt substitutes that contain potassium. Fruits like: Bananas. Apricots. Melon. Prunes and raisins. Kiwi. Nectarines and oranges. Vegetables, such as: Potatoes, sweet potatoes, and yams. Tomatoes. Leafy greens. Beets. Avocado. Pumpkin and winter squash. Beans, like lima beans. Nuts. Phosphorus Phosphorus is a mineral found in your bones. You need a balance between calcium and phosphorus to build and maintain healthy bones.  Too much added phosphorus from the foods you eat can pull calcium from your bones. Losing calcium can make your bones weak and more likely to break. Too much phosphorus can also make your skin itch. You may need to limit foods that are high in phosphorus or that have added phosphorus, such as: Liquid milk and dairy products. Dark-colored sodas or soft drinks. Bran cereals and oatmeal. Protein  Protein  helps your body make and keep muscle. Protein also helps to repair your body's cells and tissues.  One of the natural breakdown products of protein is a waste product called urea. When your kidneys aren't working well, they can't remove waste like urea. Reducing protein in your diet can help keep urea from building up in your blood. Depending on your stage of kidney disease, you may need to eat smaller portions of foods that are high in protein. Sources of animal protein include: Meat (all types). Fish and seafood. Poultry. Eggs. Dairy. Other protein foods include: Beans and legumes. Nuts and nut butter. Soy, like tofu.  Sodium Salt (sodium) helps to keep a healthy balance of fluids in your body. Too much salt can increase your blood pressure, which can harm your heart and lungs. Extra salt can also cause your body to keep too much fluid, making your kidneys work harder. You may need to limit or avoid foods that are high in salt, such as: Salt seasonings. Soy and teriyaki sauce. Meats that are: Packaged. Precooked. Cured. Processed. Salted crackers and snack foods. Fast food. Canned soups and foods. Pickled foods. Boxed mixes or ready-to-eat boxed meals and side dishes. Bottled dressings, sauces, and marinades. Talk with your dietitian about how much potassium, phosphorus, protein, and salt you may have each day. What are tips for following this plan? Reading food labels  Check the amount of salt in foods. Limit foods that have salt listed among the first five ingredients. Try to eat low-salt foods. Check the ingredient list for added phosphorus or potassium. "Phos" in an ingredient is a sign that  phosphorus has been added. Do not buy foods that are calcium-enriched or that have calcium added to them (fortified). Buy canned vegetables and beans that say "no salt added." Rinse them before eating. Lifestyle Limit the amount of protein you eat from animal sources each day. Focus on  protein from plant sources, like tofu and dried beans, peas, and lentils. Do not add salt to food when cooking or before eating. Do not eat star fruit. It can be toxic for people with kidney problems. Talk with your health care provider before taking any vitamin or mineral supplements. If told by your provider: Track how much liquid you have so you can avoid drinking too much. Try to eat foods that are made mostly from water, like gelatin, ice cream, soups, and juicy fruits and vegetables. If you have diabetes and chronic kidney disease: If you have diabetes and CKD, you need to keep your blood sugar (glucose) in the target range recommended by your provider. Follow your diabetes management plan. This may include: Checking your blood glucose regularly. Taking medicines by mouth, or taking insulin, or both. Exercising for at least 30 minutes on 5 or more days each week, or as told by your provider. Tracking how many servings of carbohydrates you eat at each meal. Not using orange juice to treat low blood sugars. Instead, use apple juice, cranberry juice, or clear soda. You may be given guidelines on what foods and nutrients you may eat, and how much you can have each day. This depends on your stage of kidney disease and whether you have high blood pressure. Follow the meal plan your dietitian gives you. Where to find more information [employer] of Diabetes and Digestive and Kidney Diseases: [employer] National Kidney Foundation: kidney.org This information is not intended to replace advice given to you by your health care provider. Make sure you discuss any questions you have with your health care provider. Document Revised: 08/04/2022 Document Reviewed: 08/04/2022 Elsevier Patient Education  2024 [employer].

## 2023-11-25 NOTE — Assessment & Plan Note (Signed)
 Continue voltaren gel as needed Continue exercises as recommended by orthopedics

## 2023-11-25 NOTE — Assessment & Plan Note (Signed)
 Continue mometasone  ointment 0.1 % BID as needed

## 2023-11-25 NOTE — Assessment & Plan Note (Signed)
Continue voltaren gel as needed

## 2023-11-25 NOTE — Progress Notes (Signed)
 Subjective:    Patient ID: Audrey Higgins, female    DOB: 03/13/1950, 73 y.o.   MRN: [id number]  HPI  Patient presents to clinic today for 4-month follow-up of chronic conditions.  OA/plantar fasciitis: Mainly in her hands.  She takes voltaren gel as needed with good relief of symptoms.  She follows with orthopedics.  Asthma: Moderate, persistent.  She denies chronic cough or shortness of breath.  She is using arnuity ellipta  as prescribed and albuterol  as needed.  There are no PFTs on file.  HTN: Her BP today is 124/70.  She is not taking any antihypertensive medications at this time.    There is no ECG on file.  History of breast/uterine cancer: In remission status post bilateral mastectomy and hysterectomy, chemo and radiation.  She no longer follows with oncology.  GAD: Situational.  She is taking buspirone  only as needed.  She is not currently seeing a therapist.  She denies depression, SI/HI.  Osteopenia: Managed with calcium and vitamin d  OTC.  She gets weightbearing exercise daily.  Bone density from 09/2019 reviewed.  Prediabetes: Her last A1c was 5.6%, 05/2023.  She is not taking any oral diabetic medication at this time.  She does not check her sugars.  HLD: Her last LDL was 93, triglycerides 94, 05/2023.  She is not taking any cholesterol-lowering medication at this time.  She tries to consume a low-fat diet.  Dyshidrotic eczema: Managed with mometasone  ointment.  She does not follow with dermatology.  CKD 3: Her last creatinine was 1.05, GFR 56, 05/2023. She is not currently on an ACEI/ARB. She does not follow with nephrology.  Review of Systems     Past Medical History:  Diagnosis Date   Allergy    Anxiety    occasionally   Arthritis    Asthma    Breast cancer (HCC) 1990   RT MASTECTOMY   Cancer (HCC) 2015   Uterine   Chicken pox    Chronic kidney disease    History of uterine cancer 08/29/2019   Hypertension    Osteopenia    Personal history of  chemotherapy 1990   BREAST CA   Personal history of radiation therapy 2014   UTERINE CA   Plantar fasciitis of right foot 10/20/2022    Current Outpatient Medications  Medication Sig Dispense Refill   albuterol  (VENTOLIN  HFA) 108 (90 Base) MCG/ACT inhaler INHALE TWO PUFFS BY MOUTH EVERY 6 HOURS AS NEEDED FOR WHEEZING (Patient taking differently: as needed. INHALE TWO PUFFS BY MOUTH EVERY 6 HOURS AS NEEDED FOR WHEEZING) 6.7 g 3   ARNUITY ELLIPTA  100 MCG/ACT AEPB INHALE ONE PUFF BY MOUTH ONE TIME DAILY 90 each 1   busPIRone  (BUSPAR ) 5 MG tablet TAKE ONE TABLET BY MOUTH ONE TIME DAILY AS NEEDED (Patient taking differently: as needed.) 90 tablet 0   loratadine  (CLARITIN ) 10 MG tablet Take 1 tablet (10 mg total) by mouth daily. 90 tablet 0   mometasone  (ELOCON ) 0.1 % cream Apply 1 Application topically daily. (Patient taking differently: Apply 1 Application topically as needed.) 45 g 0   No current facility-administered medications for this visit.    No Known Allergies  Family History  Problem Relation Age of Onset   Diabetes Mother    Breast cancer Mother 27   Arthritis Mother    Cancer Mother    Hypertension Father    Hodgkin's lymphoma Father    Cancer Father    Colon cancer Brother    Cancer Brother  Healthy Brother    Diabetes Son    Hypertension Son     Social History   Socioeconomic History   Marital status: Married    Spouse name: Not on file   Number of children: Not on file   Years of education: Not on file   Highest education level: Associate degree: academic program  Occupational History   Not on file  Tobacco Use   Smoking status: Never   Smokeless tobacco: Never  Vaping Use   Vaping status: Never Used  Substance and Sexual Activity   Alcohol use: Yes    Alcohol/week: 1.0 standard drink of alcohol    Types: 1 Glasses of wine per week    Comment: 1-2 month   Drug use: Never   Sexual activity: Yes  Other Topics Concern   Not on file  Social History  Narrative   Not on file   Social Drivers of Health   Financial Resource Strain: Low Risk  (11/23/2023)   Overall Financial Resource Strain (CARDIA)    Difficulty of Paying Living Expenses: Not hard at all  Food Insecurity: No Food Insecurity (11/23/2023)   Hunger Vital Sign    Worried About Running Out of Food in the Last Year: Never true    Ran Out of Food in the Last Year: Never true  Transportation Needs: No Transportation Needs (11/23/2023)   PRAPARE - Administrator, Civil Service (Medical): No    Lack of Transportation (Non-Medical): No  Physical Activity: Insufficiently Active (11/23/2023)   Exercise Vital Sign    Days of Exercise per Week: 7 days    Minutes of Exercise per Session: 10 min  Stress: No Stress Concern Present (11/23/2023)   [employer] of Occupational Health - Occupational Stress Questionnaire    Feeling of Stress: Not at all  Social Connections: Socially Isolated (11/23/2023)   Social Connection and Isolation Panel    Frequency of Communication with Friends and Family: Once a week    Frequency of Social Gatherings with Friends and Family: Once a week    Attends Religious Services: Patient declined    Database Administrator or Organizations: No    Attends Engineer, Structural: Not on file    Marital Status: Married  Intimate Partner Violence: Patient Declined (11/25/2022)   Humiliation, Afraid, Rape, and Kick questionnaire    Fear of Current or Ex-Partner: Patient declined    Emotionally Abused: Patient declined    Physically Abused: Patient declined    Sexually Abused: Patient declined     Constitutional: Denies fever, malaise, fatigue, headache or abrupt weight changes.  HEENT: Denies eye pain, eye redness, ear pain, ringing in the ears, wax buildup, runny nose, nasal congestion, bloody nose, or sore throat. Respiratory: Denies difficulty breathing, shortness of breath, cough or sputum production.   Cardiovascular: Denies  chest pain, chest tightness, palpitations or swelling in the hands or feet.  Gastrointestinal: Denies abdominal pain, bloating, constipation, diarrhea or blood in the stool.  GU: Denies urgency, frequency, pain with urination, burning sensation, blood in urine, odor or discharge. Musculoskeletal: Patient reports intermittent joint pain in hands, right heel pain.  Denies decrease in range of motion, difficulty with gait, muscle pain or joint swelling.  Skin: Patient reports peeling skin of fingers.  Denies redness, lesions or ulcercations.  Neurological: Denies dizziness, difficulty with memory, difficulty with speech or problems with balance and coordination.  Psych: Patient reports intermittent anxiety.  Denies depression, SI/HI.  No other specific  complaints in a complete review of systems (except as listed in HPI above).  Objective:   Physical Exam  BP 124/78 (BP Location: Right Arm, Patient Position: Sitting, Cuff Size: Normal)   Ht 5' 4 (1.626 m)   Wt 145 lb 6.4 oz (66 kg)   BMI 24.96 kg/m     Wt Readings from Last 3 Encounters:  09/23/23 142 lb 12.8 oz (64.8 kg)  05/25/23 137 lb (62.1 kg)  02/16/23 139 lb 3.2 oz (63.1 kg)    General: Appears her stated age, in NAD. Skin: Dyshidrotic eczema noted of bilateral hands HEENT: Head: normal shape and size; Eyes: sclera white, no icterus, conjunctiva pink, PERRLA and EOMs intact;  Cardiovascular: Normal rate and rhythm.  S1 and S2 noted.  No murmurs, rubs or gallops noted.  No JVD or BLE.  No carotid bruits Noted. Pulmonary/Chest: Normal effort and positive vesicular breath sounds. No respiratory distress. No wheezes, rales or ronchi noted.  Musculoskeletal: Joint enlargement noted of hands. No difficulty with gait.  Neurological: Alert and oriented.  Psychiatric: Mood and affect normal.  Behavior is normal. Judgment and thought content normal.    BMET    Component Value Date/Time   NA 141 05/25/2023 0913   K 4.6 05/25/2023  0913   CL 103 05/25/2023 0913   CO2 28 05/25/2023 0913   GLUCOSE 87 05/25/2023 0913   BUN 15 05/25/2023 0913   CREATININE 1.05 (H) 05/25/2023 0913   CALCIUM 10.2 05/25/2023 0913    Lipid Panel     Component Value Date/Time   CHOL 190 05/25/2023 0913   TRIG 94 05/25/2023 0913   HDL 78 05/25/2023 0913   CHOLHDL 2.4 05/25/2023 0913   VLDL 23.4 08/30/2019 0849   LDLCALC 93 05/25/2023 0913    CBC    Component Value Date/Time   WBC 5.8 05/25/2023 0913   RBC 4.57 05/25/2023 0913   HGB 13.4 05/25/2023 0913   HGB 13.8 11/28/2012 0829   HCT 41.4 05/25/2023 0913   HCT 32.8 (L) 12/06/2012 0542   PLT 239 05/25/2023 0913   MCV 90.6 05/25/2023 0913   MCH 29.3 05/25/2023 0913   MCHC 32.4 05/25/2023 0913   RDW 13.0 05/25/2023 0913   LYMPHSABS 2.0 08/30/2019 0849   MONOABS 0.8 08/30/2019 0849   EOSABS 0.1 08/30/2019 0849   BASOSABS 0.1 08/30/2019 0849    Hgb A1C Lab Results  Component Value Date   HGBA1C 5.6 05/25/2023     '      Assessment & Plan:    RTC in 6 months for your annual exam Angeline Laura, NP

## 2023-11-25 NOTE — Assessment & Plan Note (Signed)
 Continue arnuity ellipta  100 mcg/act daily and albuterol  108 mcg/act every 4-6 hours as needed Will monitor

## 2023-11-25 NOTE — Assessment & Plan Note (Signed)
 Kidney function reviewed Encourage adequate hydration Advised her to avoid NSAIDs OTC

## 2023-11-25 NOTE — Assessment & Plan Note (Signed)
Lipid profile reviewed Encouraged her to consume a low-fat diet

## 2023-11-25 NOTE — Assessment & Plan Note (Addendum)
 A1c reviewed Encourage low-carb diet

## 2024-05-25 ENCOUNTER — Encounter: Admitting: Internal Medicine
